# Patient Record
Sex: Female | Born: 1980 | Race: White | Hispanic: No | Marital: Married | Smoking: Never smoker
Health system: Southern US, Community
[De-identification: ages and names within clinical notes are randomized; demographics above are authoritative.]

## PROBLEM LIST (undated history)

## (undated) DIAGNOSIS — N83202 Unspecified ovarian cyst, left side: Secondary | ICD-10-CM

## (undated) DIAGNOSIS — K219 Gastro-esophageal reflux disease without esophagitis: Secondary | ICD-10-CM

## (undated) DIAGNOSIS — N83201 Unspecified ovarian cyst, right side: Secondary | ICD-10-CM

## (undated) DIAGNOSIS — N809 Endometriosis, unspecified: Secondary | ICD-10-CM

## (undated) HISTORY — DX: Unspecified ovarian cyst, left side: N83.202

## (undated) HISTORY — DX: Unspecified ovarian cyst, right side: N83.201

## (undated) HISTORY — DX: Endometriosis, unspecified: N80.9

## (undated) HISTORY — DX: Gastro-esophageal reflux disease without esophagitis: K21.9

---

## 2000-10-11 HISTORY — PX: DIAGNOSTIC LAPAROSCOPY: SUR761

## 2005-10-11 HISTORY — PX: TOOTH EXTRACTION: SUR596

## 2010-10-11 HISTORY — PX: INTRAUTERINE DEVICE INSERTION: SHX323

## 2013-06-14 ENCOUNTER — Ambulatory Visit (INDEPENDENT_AMBULATORY_CARE_PROVIDER_SITE_OTHER): Payer: 59 | Admitting: *Deleted

## 2013-06-14 DIAGNOSIS — Z23 Encounter for immunization: Secondary | ICD-10-CM

## 2013-06-22 ENCOUNTER — Encounter: Payer: Self-pay | Admitting: Family Medicine

## 2013-06-22 ENCOUNTER — Ambulatory Visit (INDEPENDENT_AMBULATORY_CARE_PROVIDER_SITE_OTHER): Payer: 59 | Admitting: Family Medicine

## 2013-06-22 VITALS — BP 98/69 | HR 73 | Resp 16 | Ht 67.0 in | Wt 125.0 lb

## 2013-06-22 DIAGNOSIS — H5789 Other specified disorders of eye and adnexa: Secondary | ICD-10-CM

## 2013-06-22 DIAGNOSIS — H579 Unspecified disorder of eye and adnexa: Secondary | ICD-10-CM

## 2013-06-22 DIAGNOSIS — J069 Acute upper respiratory infection, unspecified: Secondary | ICD-10-CM

## 2013-06-22 NOTE — Progress Notes (Signed)
  Subjective:    Patient ID: Amanda Cuevas, female    DOB: [birth year], 33 y.o.   MRN: [id number]  Amanda Cuevas is here today to discuss the conditions listed below:    Eye Problem  The right eye is affected.The current episode started yesterday. The problem has been gradually worsening. There was no injury mechanism. The pain is at a severity of 6/10. The pain is moderate. There is no known exposure to pink eye. She wears contacts. Associated symptoms include an eye discharge, eye redness, a foreign body sensation, photophobia and a recent URI. She has tried nothing for the symptoms.  URI  This is a new problem. The current episode started yesterday. The problem has been gradually worsening. There has been no fever. Associated symptoms include congestion, ear pain (Pressure ), a plugged ear sensation, rhinorrhea and sneezing. She has tried antihistamine for the symptoms. The treatment provided mild relief.    Review of Systems  HENT: Positive for congestion, ear pain (Pressure ), rhinorrhea and sneezing.   Eyes: Positive for photophobia, pain, discharge and redness.    Past Medical History  Diagnosis Date  . Bilateral ovarian cysts   . Endometriosis     Family History  Problem Relation Age of Onset  . Cancer Mother     Lung Cancer  . Stroke Mother   . Depression Mother   . COPD Mother   . Heart disease Mother   . Hypertension Mother   . Learning disabilities Mother   . Heart disease Maternal Grandmother   . Heart disease Maternal Grandfather   . Cancer Maternal Grandfather     Lung Cancer    History   Social History Narrative   Marital Status: Married Psychiatric nurse)   Children:  Son Eliberto Ivory) Daughter Luz Brazen)    Pets:  Dog Recruitment consultant)   Living Situation: Lives with  husband and children   Occupation: Production designer, theatre/television/film - [employer]   Education: College   Tobacco Use/Exposure:  None    Alcohol Use:  None   Drug Use:  None   Diet:  Regular   Exercise:  None   Hobbies:  Scrapbooking, Reading       Objective:   Physical Exam  Vitals reviewed. Constitutional: She is oriented to person, place, and time. She appears well-developed and well-nourished. No distress.  Eyes: EOM are normal. Pupils are equal, round, and reactive to light.  No sign of cornea abrasion   Cardiovascular: Normal rate and regular rhythm.   Pulmonary/Chest: Effort normal and breath sounds normal.  Neurological: She is alert and oriented to person, place, and time.  Skin: Skin is warm and dry.  Psychiatric: She has a normal mood and affect.      Assessment & Plan:

## 2013-08-14 ENCOUNTER — Other Ambulatory Visit: Payer: 59

## 2013-08-15 ENCOUNTER — Other Ambulatory Visit: Payer: 59

## 2013-08-15 LAB — CBC WITH DIFFERENTIAL/PLATELET
Basophils Absolute: 0 10*3/uL (ref 0.0–0.1)
Basophils Relative: 0 % (ref 0–1)
Eosinophils Absolute: 0 10*3/uL (ref 0.0–0.7)
Eosinophils Relative: 1 % (ref 0–5)
HCT: 39.3 % (ref 36.0–46.0)
Hemoglobin: 13.9 g/dL (ref 12.0–15.0)
Lymphocytes Relative: 27 % (ref 12–46)
Lymphs Abs: 1.4 10*3/uL (ref 0.7–4.0)
MCH: 30.7 pg (ref 26.0–34.0)
MCHC: 35.4 g/dL (ref 30.0–36.0)
MCV: 86.8 fL (ref 78.0–100.0)
Monocytes Absolute: 0.3 10*3/uL (ref 0.1–1.0)
Monocytes Relative: 5 % (ref 3–12)
Neutro Abs: 3.4 10*3/uL (ref 1.7–7.7)
Neutrophils Relative %: 67 % (ref 43–77)
Platelets: 182 10*3/uL (ref 150–400)
RBC: 4.53 MIL/uL (ref 3.87–5.11)
RDW: 12 % (ref 11.5–15.5)
WBC: 5 10*3/uL (ref 4.0–10.5)

## 2013-08-15 LAB — COMPLETE METABOLIC PANEL WITH GFR
ALT: 12 U/L (ref 0–35)
AST: 13 U/L (ref 0–37)
Albumin: 4.6 g/dL (ref 3.5–5.2)
Alkaline Phosphatase: 46 U/L (ref 39–117)
BUN: 11 mg/dL (ref 6–23)
CO2: 25 mEq/L (ref 19–32)
Calcium: 9.3 mg/dL (ref 8.4–10.5)
Chloride: 104 mEq/L (ref 96–112)
Creat: 0.56 mg/dL (ref 0.50–1.10)
GFR, Est African American: >89 mL/min
GFR, Est Non African American: >89 mL/min
Glucose, Bld: 115 mg/dL — ABNORMAL HIGH (ref 70–99)
Potassium: 3.9 mEq/L (ref 3.5–5.3)
Sodium: 136 mEq/L (ref 135–145)
Total Bilirubin: 0.6 mg/dL (ref 0.3–1.2)
Total Protein: 7.1 g/dL (ref 6.0–8.3)

## 2013-08-15 LAB — LIPID PANEL
Cholesterol: 139 mg/dL (ref 0–200)
HDL: 56 mg/dL (ref >39)
LDL Cholesterol: 77 mg/dL (ref 0–99)
Total CHOL/HDL Ratio: 2.5 Ratio
Triglycerides: 31 mg/dL (ref <150)
VLDL: 6 mg/dL (ref 0–40)

## 2013-08-15 LAB — TSH: TSH: 0.644 u[IU]/mL (ref 0.350–4.500)

## 2013-08-24 ENCOUNTER — Ambulatory Visit (INDEPENDENT_AMBULATORY_CARE_PROVIDER_SITE_OTHER): Payer: 59 | Admitting: Family Medicine

## 2013-08-24 ENCOUNTER — Encounter: Payer: Self-pay | Admitting: Family Medicine

## 2013-08-24 VITALS — BP 106/70 | HR 78 | Resp 16 | Ht 67.0 in | Wt 126.0 lb

## 2013-08-24 DIAGNOSIS — L708 Other acne: Secondary | ICD-10-CM

## 2013-08-24 DIAGNOSIS — L709 Acne, unspecified: Secondary | ICD-10-CM

## 2013-08-24 DIAGNOSIS — F4321 Adjustment disorder with depressed mood: Secondary | ICD-10-CM

## 2013-08-24 MED ORDER — DOXYCYCLINE HYCLATE 50 MG PO CAPS: 50.0000 mg | ORAL_CAPSULE | Freq: Two times a day (BID) | ORAL | Status: DC

## 2013-08-24 MED ORDER — MUPIROCIN CALCIUM 2 % EX CREA: TOPICAL_CREAM | CUTANEOUS | Status: DC

## 2013-08-24 NOTE — Progress Notes (Signed)
  Subjective:    Patient ID: Amanda Cuevas, female    DOB: [birth year], 32 y.o.   MRN: [id number]  HPI  Amanda Cuevas is here today to discuss her acne. She never had trouble with this as a teenager and has just recently developed this problem.  She has used OTC acne creams but they have not helped. She describes her acne as mild to moderate at times and says that she feels that it is worsening.  Her outbreaks are concentrated on her chin and around her jaw line.     Review of Systems  Constitutional: Negative.   HENT: Negative.   Eyes: Negative.   Respiratory: Negative.   Cardiovascular: Negative.   Gastrointestinal: Negative.   Endocrine: Negative.   Genitourinary: Negative.   Musculoskeletal: Negative.   Skin:       Acne on chin  Allergic/Immunologic: Negative.   Neurological: Negative.   Hematological: Negative.   Psychiatric/Behavioral: Negative.      Past Medical History  Diagnosis Date  . Bilateral ovarian cysts   . Endometriosis       Family History  Problem Relation Age of Onset  . Cancer Mother     Lung Cancer  . Stroke Mother   . Depression Mother   . COPD Mother   . Heart disease Mother   . Hypertension Mother   . Learning disabilities Mother   . Heart disease Maternal Grandmother   . Heart disease Maternal Grandfather   . 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 Constitutional: She is oriented to person, place, and time. She appears well-developed and  well-nourished.  HENT:  Head: Normocephalic.  Eyes: Conjunctivae are normal. No scleral icterus.  Neck: Normal range of motion. Neck supple. No thyromegaly present.  Cardiovascular: Normal rate.   Pulmonary/Chest: Effort normal and breath sounds normal.  Musculoskeletal: Normal range of motion.  Neurological: She is alert and oriented to person, place, and time.  Skin: Skin is warm and dry.  Mild acne is present (Chin)   Psychiatric: She has a normal mood and affect. Her behavior is normal. Judgment and thought content normal.  She gets tearful when she speaks of her mom.            Assessment & Plan:

## 2013-08-25 DIAGNOSIS — H5789 Other specified disorders of eye and adnexa: Secondary | ICD-10-CM | POA: Insufficient documentation

## 2013-08-25 DIAGNOSIS — J069 Acute upper respiratory infection, unspecified: Secondary | ICD-10-CM | POA: Insufficient documentation

## 2013-08-25 NOTE — Assessment & Plan Note (Signed)
A fluorescein strip strip was used to check for a cornea abrasion.

## 2013-08-25 NOTE — Assessment & Plan Note (Signed)
She is go try a decongestant for her ear pressure.

## 2013-08-26 DIAGNOSIS — F4321 Adjustment disorder with depressed mood: Secondary | ICD-10-CM | POA: Insufficient documentation

## 2013-08-26 DIAGNOSIS — L709 Acne, unspecified: Secondary | ICD-10-CM | POA: Insufficient documentation

## 2013-08-26 NOTE — Assessment & Plan Note (Signed)
Amanda Cuevas continues to struggle after losing her mom in July.  She knows that the holidays are going to be a rough time for her and her family. She was given samples of Viibryd 10/20 mg to try.

## 2013-08-26 NOTE — Patient Instructions (Signed)
1)  Acne - Try the combination of doxycycline, Bactroban and Retin A Micro.    2)  Mood - She was given samples of Viibryd to have on hand if she decides to try it.   Acne Acne is a skin problem that causes pimples. Acne occurs when the pores in your skin get blocked. Your pores may become red, sore, and swollen (inflamed), or infected with a common skin bacterium (Propionibacterium acnes). Acne is a common skin problem. Up to 80% of people get acne at some time. Acne is especially common from the ages of 69 to 47. Acne usually goes away over time with proper treatment. CAUSES  Your pores each contain an oil gland. The oil glands make an oily substance called sebum. Acne happens when these glands get plugged with sebum, dead skin cells, and dirt. The P. acnes bacteria that are normally found in the oil glands then multiply, causing inflammation. Acne is commonly triggered by changes in your hormones. These hormonal changes can cause the oil glands to get bigger and to make more sebum. Factors that can make acne worse include:  Hormone changes during adolescence.  Hormone changes during women's menstrual cycles.  Hormone changes during pregnancy.  Oil-based cosmetics and hair products.  Harshly scrubbing the skin.  Strong soaps.  Stress.  Hormone problems due to certain diseases.  Long or oily hair rubbing against the skin.  Certain medicines.  Pressure from headbands, backpacks, or shoulder pads.  Exposure to certain oils and chemicals. SYMPTOMS  Acne often occurs on the face, neck, chest, and upper back. Symptoms include:  Small, red bumps (pimples or papules).  Whiteheads (closed comedones).  Blackheads (open comedones).  Small, pus-filled pimples (pustules).  Big, red pimples or pustules that feel tender. More severe acne can cause:  An infected area that contains a collection of pus (abscess).  Hard, painful, fluid-filled sacs (cysts).  Scars. DIAGNOSIS  Your  caregiver can usually tell what the problem is by doing a physical exam. TREATMENT  There are many good treatments for acne. Some are available over-the-counter and some are available with a prescription. The treatment that is best for you depends on the type of acne you have and how severe it is. It may take 2 months of treatment before your acne gets better. Common treatments include:  Creams and lotions that prevent oil glands from clogging.  Creams and lotions that treat or prevent infections and inflammation.  Antibiotics applied to the skin or taken as a pill.  Pills that decrease sebum production.  Birth control pills.  Light or laser treatments.  Minor surgery.  Injections of medicine into the affected areas.  Chemicals that cause peeling of the skin. HOME CARE INSTRUCTIONS  Good skin care is the most important part of treatment.  Wash your skin gently at least twice a day and after exercise. Always wash your skin before bed.  Use mild soap.  After each wash, apply a water-based skin moisturizer.  Keep your hair clean and off of your face. Shampoo your hair daily.  Only take medicines as directed by your caregiver.  Use a sunscreen or sunblock with SPF 30 or greater. This is especially important when you are using acne medicines.  Choose cosmetics that are noncomedogenic. This means they do not plug the oil glands.  Avoid leaning your chin or forehead on your hands.  Avoid wearing tight headbands or hats.  Avoid picking or squeezing your pimples. This can make your acne worse  and cause scarring. SEEK MEDICAL CARE IF:   Your acne is not better after 8 weeks.  Your acne gets worse.  You have a large area of skin that is red or tender. Document Released: 09/24/2000 Document Revised: 12/20/2011 Document Reviewed: 07/16/2011 [facility] Patient Information 2014 [place], [place].

## 2013-08-26 NOTE — Assessment & Plan Note (Signed)
She is to try a combination of doxycycline, Retin A Micro (0.4%) and Bactroban.  We'll see how this works. She might benefit from some Epiduo and/or Veltin.  She may try samples in the future.

## 2013-08-27 ENCOUNTER — Ambulatory Visit (INDEPENDENT_AMBULATORY_CARE_PROVIDER_SITE_OTHER): Payer: 59 | Admitting: Family Medicine

## 2013-08-27 ENCOUNTER — Encounter: Payer: Self-pay | Admitting: Family Medicine

## 2013-08-27 VITALS — BP 113/62 | HR 86 | Resp 16 | Ht 67.0 in | Wt 128.0 lb

## 2013-08-27 DIAGNOSIS — L709 Acne, unspecified: Secondary | ICD-10-CM

## 2013-08-27 DIAGNOSIS — R7301 Impaired fasting glucose: Secondary | ICD-10-CM

## 2013-08-27 DIAGNOSIS — L708 Other acne: Secondary | ICD-10-CM

## 2013-08-27 DIAGNOSIS — K589 Irritable bowel syndrome without diarrhea: Secondary | ICD-10-CM

## 2013-08-27 DIAGNOSIS — Z0000 Encounter for general adult medical examination without abnormal findings: Secondary | ICD-10-CM

## 2013-08-27 LAB — POCT URINALYSIS DIPSTICK
Bilirubin, UA: NEGATIVE
Blood, UA: NEGATIVE
Glucose, UA: NEGATIVE
Ketones, UA: NEGATIVE
Leukocytes, UA: NEGATIVE
Nitrite, UA: NEGATIVE
Protein, UA: NEGATIVE
Spec Grav, UA: <=1.005
Urobilinogen, UA: NEGATIVE
pH, UA: 8

## 2013-08-27 LAB — POCT GLYCOSYLATED HEMOGLOBIN (HGB A1C): Hemoglobin A1C: 4.7

## 2013-08-27 MED ORDER — HYOSCYAMINE SULFATE 0.125 MG SL SUBL: 0.1250 mg | SUBLINGUAL_TABLET | Freq: Four times a day (QID) | SUBLINGUAL | Status: DC | PRN

## 2013-08-27 MED ORDER — MUPIROCIN CALCIUM 2 % EX CREA: TOPICAL_CREAM | CUTANEOUS | Status: DC

## 2013-08-27 MED ORDER — DOXYCYCLINE HYCLATE 50 MG PO CAPS: 50.0000 mg | ORAL_CAPSULE | Freq: Two times a day (BID) | ORAL | Status: DC

## 2013-08-27 NOTE — Progress Notes (Signed)
Subjective:    Patient ID: Amanda Cuevas, female    DOB: [birth year], 32 y.o.   MRN: [id number]  HPI  Amanda Cuevas is here today for her annual CPE.  She has done pretty well since her last office visit. She continues to struggle with acne which is new for her and IBS symptoms.     Review of Systems  Constitutional: Negative.   HENT: Negative.   Eyes: Negative.   Respiratory: Negative.   Cardiovascular: Negative.   Gastrointestinal: Negative.   Endocrine: Negative.   Genitourinary: Negative.   Musculoskeletal: Negative.   Skin: Negative.   Allergic/Immunologic: Negative.   Neurological: Negative.   Hematological: Negative.   Psychiatric/Behavioral: Negative.      Past Medical History  Diagnosis Date  . Bilateral ovarian cysts   . Endometriosis      Family History  Problem Relation Age of Onset  . Cancer Mother     Lung Cancer  . Stroke Mother   . Depression Mother   . COPD Mother   . Heart disease Mother   . Hypertension Mother   . Learning disabilities Mother   . Heart disease Maternal Grandmother   . Heart disease Maternal Grandfather   . 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 Constitutional: She is oriented to person, place, and time. She appears well-developed and well-nourished.  HENT:  Head: Normocephalic and atraumatic.  Right Ear: External ear normal.  Left Ear: External ear normal.  Nose: Nose normal.  Mouth/Throat: Oropharynx is  clear and moist.  Eyes: Conjunctivae and EOM are normal. Pupils are equal, round, and reactive to light.  Neck: Normal range of motion. No thyromegaly present.  Cardiovascular: Normal rate, regular rhythm, normal heart sounds and intact distal pulses.  Exam reveals no gallop and no friction rub.   No murmur heard. Pulmonary/Chest: Effort normal and breath sounds normal. Right breast exhibits no inverted nipple, no mass, no nipple discharge, no skin change and no tenderness. Left breast exhibits no inverted nipple, no mass, no nipple discharge, no skin change and no tenderness. Breasts are symmetrical.  Abdominal: Soft. Bowel sounds are normal.  Genitourinary: No breast swelling, tenderness, discharge or bleeding.  Musculoskeletal: Normal range of motion. She exhibits no edema and no tenderness.  Lymphadenopathy:    She has no cervical adenopathy.  Neurological: She is alert and oriented to person, place, and time. She has normal reflexes.  Skin: Skin is warm and dry.  Psychiatric: She has a normal mood and affect. Her behavior is normal. Judgment and thought content normal.      Assessment & Plan:    Iliyah was seen today for annual exam.  Diagnoses and associated orders for this visit:  Routine general medical examination at a health care facility Comments: Normal Exam.   - POCT urinalysis dipstick   Impaired fasting glucose Comments: Her FBS was  elevated 115 so we checked an A1c which was perfect at 4.7.   - POCT HgB A1C  Acne Comments: She continues to struggle with her acne.  She'll try some doxycycline and Bactroban.   - doxycycline (VIBRAMYCIN) 50 MG capsule; Take 1 capsule (50 mg total) by mouth 2 (two) times daily. - mupirocin cream (BACTROBAN) 2 %; Apply to affected area 3 times daily  IBS (irritable bowel syndrome) Comments: She has been having some increased IBS symptoms especially when she eats what she shouldn't. She is to try some Levsin.   - Discontinue:  hyoscyamine (LEVSIN SL) 0.125 MG SL tablet; Place 1 tablet (0.125 mg total) under the tongue every 6 (six) hours as needed.  TIME SPENT "FACE TO FACE" WITH PATIENT -  45 MINS

## 2013-09-18 ENCOUNTER — Encounter: Payer: Self-pay | Admitting: *Deleted

## 2013-09-18 ENCOUNTER — Ambulatory Visit (INDEPENDENT_AMBULATORY_CARE_PROVIDER_SITE_OTHER): Payer: 59 | Admitting: Family Medicine

## 2013-09-18 VITALS — BP 113/71 | HR 78 | Resp 16 | Wt 128.0 lb

## 2013-09-18 DIAGNOSIS — H6120 Impacted cerumen, unspecified ear: Secondary | ICD-10-CM

## 2013-09-18 DIAGNOSIS — H9209 Otalgia, unspecified ear: Secondary | ICD-10-CM

## 2013-09-18 NOTE — Progress Notes (Signed)
   Subjective:    Patient ID: Amanda Cuevas, female    DOB: [birth year], 32 y.o.   MRN: [id number]   Amanda Cuevas is here today complaining of bilateral ear pain and fullness.  Amanda Cuevas feels that Amanda Cuevas needs to have her ears flushed.      Otalgia  There is pain in both ears. The current episode started in the past 7 days. The problem occurs constantly. There has been no fever. The pain is moderate.    Review of Systems  HENT: Positive for ear pain.   Neurological: Positive for dizziness.  All other systems reviewed and are negative.     Past Medical History  Diagnosis Date  . Bilateral ovarian cysts   . Endometriosis      Past Surgical History  Procedure Laterality Date  . Tooth extraction  2007  . Diagnostic laparoscopy  2002    Ovarian Cysts - Endometriosis  . 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 Cancer Mother     Lung Cancer  . Stroke Mother   . Depression Mother   . COPD Mother   . Heart disease Mother   . Hypertension Mother   . Learning disabilities Mother   . Heart disease Maternal Grandmother   . Heart disease Maternal Grandfather   . Cancer Maternal Grandfather     Lung Cancer     No current outpatient prescriptions on file prior to visit.   No current facility-administered medications on file prior to visit.     No Known Allergies   Immunization History  Administered Date(s) Administered  . Hepatitis B 05/13/2009, 07/14/2009,  01/07/2010  . Influenza,inj,Quad PF,36+ Mos 06/14/2013  . Tdap 10/28/2010        Objective:   Physical Exam  Constitutional: Amanda Cuevas appears well-developed and well-nourished. No distress.  HENT:  Canals are blocked with cerumen.    Eyes: No scleral icterus.  Neck: No thyromegaly present.  Cardiovascular: Normal rate and regular rhythm.   Pulmonary/Chest: Effort normal and breath sounds normal.  Skin: Skin is warm and dry. No rash noted.  Psychiatric: Amanda Cuevas has a normal mood and affect. Her behavior is normal. Judgment and thought content normal.      Assessment & Plan:    Amanda Cuevas was seen today for otalgia.  Diagnoses and associated orders for this visit:  Otalgia, bilateral Comments: Amanda Cuevas is to try some Cortane B for her ears.  - Pramoxine-HC-Chloroxylenol Aq (CORTANE-B AQUEOUS) 10-10-1 MG/ML SOLN; Place 3 drops in ear(s) 3 (three) times daily.  Impacted cerumen, bilateral Comments: Her ears were flushed without difficulty.

## 2013-09-18 NOTE — Assessment & Plan Note (Signed)
The patient's ears were inspected and the ear canals were occluded with cerumen.  The canals were flushed using a syringe and butterfly needle tubing.  The cerumen was loosened and an ear scoop was used to remove pieces from the canal.  The patient tolerated the procedure fine.

## 2013-10-04 ENCOUNTER — Other Ambulatory Visit: Payer: Self-pay | Admitting: Family Medicine

## 2013-10-04 DIAGNOSIS — Z20828 Contact with and (suspected) exposure to other viral communicable diseases: Secondary | ICD-10-CM

## 2013-10-04 MED ORDER — OSELTAMIVIR PHOSPHATE 75 MG PO CAPS: 75.0000 mg | ORAL_CAPSULE | Freq: Every day | ORAL | Status: AC

## 2013-10-15 DIAGNOSIS — Z0000 Encounter for general adult medical examination without abnormal findings: Secondary | ICD-10-CM | POA: Insufficient documentation

## 2013-10-20 ENCOUNTER — Encounter: Payer: Self-pay | Admitting: Family Medicine

## 2013-10-20 DIAGNOSIS — R7301 Impaired fasting glucose: Secondary | ICD-10-CM | POA: Insufficient documentation

## 2013-10-20 DIAGNOSIS — K589 Irritable bowel syndrome without diarrhea: Secondary | ICD-10-CM | POA: Insufficient documentation

## 2013-10-20 DIAGNOSIS — H9209 Otalgia, unspecified ear: Secondary | ICD-10-CM | POA: Insufficient documentation

## 2013-10-20 MED ORDER — PRAMOXINE-HC-CHLOROXYLENOL AQ 10-10-1 MG/ML OT SOLN: 3.0000 [drp] | Freq: Three times a day (TID) | OTIC | Status: DC

## 2013-12-10 ENCOUNTER — Ambulatory Visit (HOSPITAL_BASED_OUTPATIENT_CLINIC_OR_DEPARTMENT_OTHER)
Admission: RE | Admit: 2013-12-10 | Discharge: 2013-12-10 | Disposition: A | Discharge status: Home / Self Care | Payer: 59 | Source: Other Acute Inpatient Hospital | Attending: Family Medicine | Admitting: Family Medicine

## 2013-12-10 ENCOUNTER — Encounter: Payer: Self-pay | Admitting: Family Medicine

## 2013-12-10 ENCOUNTER — Ambulatory Visit (INDEPENDENT_AMBULATORY_CARE_PROVIDER_SITE_OTHER): Payer: 59 | Admitting: Family Medicine

## 2013-12-10 ENCOUNTER — Ambulatory Visit (HOSPITAL_BASED_OUTPATIENT_CLINIC_OR_DEPARTMENT_OTHER)
Admission: RE | Admit: 2013-12-10 | Discharge: 2013-12-10 | Disposition: A | Discharge status: Home / Self Care | Payer: 59 | Source: Ambulatory Visit | Attending: Family Medicine | Admitting: Family Medicine

## 2013-12-10 VITALS — BP 95/64 | HR 106 | Temp 100.0°F | Resp 16

## 2013-12-10 DIAGNOSIS — R110 Nausea: Secondary | ICD-10-CM

## 2013-12-10 DIAGNOSIS — Z3202 Encounter for pregnancy test, result negative: Secondary | ICD-10-CM

## 2013-12-10 DIAGNOSIS — R52 Pain, unspecified: Secondary | ICD-10-CM

## 2013-12-10 DIAGNOSIS — R1013 Epigastric pain: Secondary | ICD-10-CM | POA: Insufficient documentation

## 2013-12-10 DIAGNOSIS — R109 Unspecified abdominal pain: Secondary | ICD-10-CM

## 2013-12-10 DIAGNOSIS — R509 Fever, unspecified: Secondary | ICD-10-CM

## 2013-12-10 DIAGNOSIS — M549 Dorsalgia, unspecified: Secondary | ICD-10-CM

## 2013-12-10 LAB — CBC WITH DIFFERENTIAL/PLATELET
Basophils Absolute: 0 10*3/uL (ref 0.0–0.1)
Basophils Relative: 0 % (ref 0–1)
Eosinophils Absolute: 0 10*3/uL (ref 0.0–0.7)
Eosinophils Relative: 0 % (ref 0–5)
HCT: 38.3 % (ref 36.0–46.0)
Hemoglobin: 13.3 g/dL (ref 12.0–15.0)
Lymphocytes Relative: 6 % — ABNORMAL LOW (ref 12–46)
Lymphs Abs: 0.4 10*3/uL — ABNORMAL LOW (ref 0.7–4.0)
MCH: 31.3 pg (ref 26.0–34.0)
MCHC: 34.7 g/dL (ref 30.0–36.0)
MCV: 90.1 fL (ref 78.0–100.0)
Monocytes Absolute: 0.3 10*3/uL (ref 0.1–1.0)
Monocytes Relative: 5 % (ref 3–12)
Neutro Abs: 5.1 10*3/uL (ref 1.7–7.7)
Neutrophils Relative %: 89 % — ABNORMAL HIGH (ref 43–77)
Platelets: 134 10*3/uL — ABNORMAL LOW (ref 150–400)
RBC: 4.25 MIL/uL (ref 3.87–5.11)
RDW: 12.1 % (ref 11.5–15.5)
WBC: 5.7 10*3/uL (ref 4.0–10.5)

## 2013-12-10 LAB — POCT URINALYSIS DIPSTICK
Blood, UA: NEGATIVE
Glucose, UA: NEGATIVE
Ketones, UA: NEGATIVE
Leukocytes, UA: NEGATIVE
Nitrite, UA: NEGATIVE
Protein, UA: NEGATIVE
Spec Grav, UA: 1.01
Urobilinogen, UA: NEGATIVE
pH, UA: 5

## 2013-12-10 LAB — COMPREHENSIVE METABOLIC PANEL
ALT: 16 U/L (ref 0–35)
AST: 17 U/L (ref 0–37)
Albumin: 4.2 g/dL (ref 3.5–5.2)
Alkaline Phosphatase: 52 U/L (ref 39–117)
BUN: 12 mg/dL (ref 6–23)
CO2: 25 mEq/L (ref 19–32)
Calcium: 8.9 mg/dL (ref 8.4–10.5)
Chloride: 100 mEq/L (ref 96–112)
Creatinine, Ser: 0.7 mg/dL (ref 0.50–1.10)
GFR calc Af Amer: >90 mL/min (ref >90)
GFR calc non Af Amer: >90 mL/min (ref >90)
Glucose, Bld: 114 mg/dL — ABNORMAL HIGH (ref 70–99)
Potassium: 3.9 mEq/L (ref 3.7–5.3)
Sodium: 138 mEq/L (ref 137–147)
Total Bilirubin: 0.7 mg/dL (ref 0.3–1.2)
Total Protein: 6.7 g/dL (ref 6.0–8.3)

## 2013-12-10 LAB — LIPASE, BLOOD: Lipase: 19 U/L (ref 11–59)

## 2013-12-10 LAB — POCT URINE PREGNANCY: Preg Test, Ur: NEGATIVE

## 2013-12-10 MED ORDER — SUCRALFATE 1 G PO TABS: 1.0000 g | ORAL_TABLET | Freq: Four times a day (QID) | ORAL | Status: DC

## 2013-12-10 MED ORDER — ONDANSETRON 8 MG PO TBDP: 8.0000 mg | ORAL_TABLET | Freq: Three times a day (TID) | ORAL | Status: DC | PRN

## 2013-12-10 NOTE — Progress Notes (Signed)
Subjective:    Patient ID: Amanda AmatoMartha Elaine Cuevas, female    DOB: 02-06-1981, 33 y.o.   MRN: [id number]  Amanda Cuevas is here today with her husband complaining of generalized abdominal pain.  She [place] yesterday for lunch.  She developed an abdominal discomfort with diarrhea and her symptoms have worsened.    Abdominal Pain This is a new problem. The current episode started yesterday. The onset quality is sudden. The problem occurs constantly. The problem has been gradually worsening. The pain is located in the generalized abdominal region. The pain is at a severity of 6/10. The pain is moderate. The quality of the pain is cramping and a sensation of fullness. Pain radiation: low and mid back. Associated symptoms include belching, diarrhea, a fever, headaches and myalgias. The pain is aggravated by movement. The pain is relieved by nothing. She has tried nothing for the symptoms.     Review of Systems  Constitutional: Positive for fever.  Gastrointestinal: Positive for abdominal pain and diarrhea.  Musculoskeletal: Positive for myalgias.  Neurological: Positive for headaches.     Past Medical History  Diagnosis Date  . Bilateral ovarian cysts   . Endometriosis      Past Surgical History  Procedure Laterality Date  . Tooth extraction  2007  . Diagnostic laparoscopy  2002    Ovarian Cysts - Endometriosis  . 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 Cancer  Mother     Lung Cancer  . Stroke Mother   . Depression Mother   . COPD Mother   . Heart disease Mother   . Hypertension Mother   . Learning disabilities Mother   . Heart disease Maternal Grandmother   . Heart disease Maternal Grandfather   . Cancer Maternal Grandfather     Lung Cancer     No Known Allergies   Immunization History  Administered Date(s) Administered  . Hepatitis B 05/13/2009, 07/14/2009, 01/07/2010  . Influenza,inj,Quad PF,36+ Mos 06/14/2013  . Tdap 10/28/2010       Objective:   Physical Exam  Constitutional: No distress.  HENT:  Nose: Nose normal.  Mouth/Throat: Oropharynx is clear and moist and mucous membranes are normal.  Eyes: No scleral icterus.  Neck: Neck supple.  Cardiovascular: Normal rate, regular rhythm and normal heart sounds.   Pulmonary/Chest: Effort normal and breath sounds normal.  Abdominal: Soft. Bowel sounds are normal. She exhibits no distension and no mass. There is tenderness. There is no rebound and no guarding.  Musculoskeletal: She exhibits no edema.  Lymphadenopathy:    She has no cervical adenopathy.  Neurological: She is alert.  Skin: Skin is warm and dry. She is not diaphoretic.  Psychiatric: She has a normal mood and affect. Her behavior is normal. Judgment and thought content normal.      Assessment & Plan:    Amanda Cuevas was seen today  for abdominal pain.  Diagnoses and associated orders for this visit:  Abdominal pain, epigastric - US Abdomen Complete - CBC w/Diff - Lipase - Comprehensive metabolic panel - sucralfate (CARAFATE) 1 G tablet; Take 1 tablet (1 g total) by mouth 4 (four) times daily. - NM Hepatobiliary; Future  Back pain - POCT urinalysis dipstick  Negative pregnancy test - POCT urine pregnancy  Fever, unspecified - Urine Culture  Nausea alone - ondansetron (ZOFRAN ODT) 8 MG disintegrating tablet; Take 1 tablet (8 mg total) by mouth every 8 (eight) hours as needed for nausea or  vomiting.  Acute left flank pain - CT Abdomen Pelvis Wo Contrast; Future   TIME SPENT "FACE TO FACE" WITH PATIENT -  30 MINS

## 2013-12-11 LAB — URINE CULTURE
Colony Count: NO GROWTH
Organism ID, Bacteria: NO GROWTH

## 2014-04-26 ENCOUNTER — Ambulatory Visit (HOSPITAL_BASED_OUTPATIENT_CLINIC_OR_DEPARTMENT_OTHER)
Admission: RE | Admit: 2014-04-26 | Discharge: 2014-04-26 | Disposition: A | Discharge status: Home / Self Care | Payer: 59 | Source: Ambulatory Visit | Attending: Family Medicine | Admitting: Family Medicine

## 2014-04-26 ENCOUNTER — Other Ambulatory Visit: Payer: Self-pay | Admitting: Family Medicine

## 2014-04-26 DIAGNOSIS — M542 Cervicalgia: Secondary | ICD-10-CM

## 2014-04-26 DIAGNOSIS — M47812 Spondylosis without myelopathy or radiculopathy, cervical region: Secondary | ICD-10-CM | POA: Insufficient documentation

## 2014-05-03 ENCOUNTER — Other Ambulatory Visit: Payer: Self-pay | Admitting: Family Medicine

## 2014-05-03 ENCOUNTER — Other Ambulatory Visit: Payer: Self-pay | Admitting: *Deleted

## 2014-05-03 DIAGNOSIS — R5383 Other fatigue: Principal | ICD-10-CM

## 2014-05-03 DIAGNOSIS — R5381 Other malaise: Secondary | ICD-10-CM

## 2014-05-03 LAB — CBC WITH DIFFERENTIAL/PLATELET
Basophils Absolute: 0 10*3/uL (ref 0.0–0.1)
Basophils Relative: 1 % (ref 0–1)
Eosinophils Absolute: 0 10*3/uL (ref 0.0–0.7)
Eosinophils Relative: 1 % (ref 0–5)
HCT: 35.1 % — ABNORMAL LOW (ref 36.0–46.0)
Hemoglobin: 12.6 g/dL (ref 12.0–15.0)
Lymphocytes Relative: 37 % (ref 12–46)
Lymphs Abs: 1.4 10*3/uL (ref 0.7–4.0)
MCH: 30.6 pg (ref 26.0–34.0)
MCHC: 35.9 g/dL (ref 30.0–36.0)
MCV: 85.2 fL (ref 78.0–100.0)
Monocytes Absolute: 0.2 10*3/uL (ref 0.1–1.0)
Monocytes Relative: 6 % (ref 3–12)
Neutro Abs: 2 10*3/uL (ref 1.7–7.7)
Neutrophils Relative %: 55 % (ref 43–77)
Platelets: 185 10*3/uL (ref 150–400)
RBC: 4.12 MIL/uL (ref 3.87–5.11)
RDW: 13 % (ref 11.5–15.5)
WBC: 3.7 10*3/uL — ABNORMAL LOW (ref 4.0–10.5)

## 2014-05-03 LAB — COMPLETE METABOLIC PANEL WITH GFR
ALT: 10 U/L (ref 0–35)
AST: 13 U/L (ref 0–37)
Albumin: 4.2 g/dL (ref 3.5–5.2)
Alkaline Phosphatase: 42 U/L (ref 39–117)
BUN: 11 mg/dL (ref 6–23)
CO2: 26 mEq/L (ref 19–32)
Calcium: 8.8 mg/dL (ref 8.4–10.5)
Chloride: 106 mEq/L (ref 96–112)
Creat: 0.65 mg/dL (ref 0.50–1.10)
GFR, Est African American: >89 mL/min
GFR, Est Non African American: >89 mL/min
Glucose, Bld: 89 mg/dL (ref 70–99)
Potassium: 3.8 mEq/L (ref 3.5–5.3)
Sodium: 139 mEq/L (ref 135–145)
Total Bilirubin: 0.5 mg/dL (ref 0.2–1.2)
Total Protein: 6.6 g/dL (ref 6.0–8.3)

## 2014-05-04 LAB — T4, FREE: Free T4: 1.16 ng/dL (ref 0.80–1.80)

## 2014-05-04 LAB — TSH: TSH: 1.032 u[IU]/mL (ref 0.350–4.500)

## 2014-06-14 ENCOUNTER — Other Ambulatory Visit: Payer: Self-pay | Admitting: Family Medicine

## 2014-06-14 DIAGNOSIS — K219 Gastro-esophageal reflux disease without esophagitis: Secondary | ICD-10-CM

## 2014-06-14 MED ORDER — PANTOPRAZOLE SODIUM 40 MG PO TBEC: 40.0000 mg | DELAYED_RELEASE_TABLET | Freq: Two times a day (BID) | ORAL | Status: DC

## 2015-10-30 ENCOUNTER — Other Ambulatory Visit: Payer: Self-pay | Admitting: Obstetrics and Gynecology

## 2015-10-30 MED ORDER — MISOPROSTOL 200 MCG PO TABS: ORAL_TABLET | ORAL | Status: DC

## 2015-10-31 ENCOUNTER — Ambulatory Visit (INDEPENDENT_AMBULATORY_CARE_PROVIDER_SITE_OTHER): Payer: 59 | Admitting: Obstetrics and Gynecology

## 2015-10-31 ENCOUNTER — Encounter: Payer: Self-pay | Admitting: Obstetrics and Gynecology

## 2015-10-31 VITALS — BP 100/70 | HR 88 | Resp 16 | Ht 67.0 in | Wt 137.0 lb

## 2015-10-31 DIAGNOSIS — Z30433 Encounter for removal and reinsertion of intrauterine contraceptive device: Secondary | ICD-10-CM

## 2015-10-31 DIAGNOSIS — Z01419 Encounter for gynecological examination (general) (routine) without abnormal findings: Secondary | ICD-10-CM | POA: Diagnosis not present

## 2015-10-31 DIAGNOSIS — Z1151 Encounter for screening for human papillomavirus (HPV): Secondary | ICD-10-CM | POA: Diagnosis not present

## 2015-10-31 DIAGNOSIS — Z124 Encounter for screening for malignant neoplasm of cervix: Secondary | ICD-10-CM

## 2015-10-31 NOTE — Patient Instructions (Signed)

## 2015-10-31 NOTE — Progress Notes (Signed)
35 y.o. No obstetric history on file. MarriedCaucasianF here for annual exam.  She doesn't have cycles with the mirena, random spotting. Occasional dyspareunia, deep, mostly fine.  At 19 she had laparoscopy, ? H/o endometriosis, h/o severe dysmenorrhea, no issues after having babies.     No LMP recorded. Patient is not currently having periods (Reason: IUD).          Sexually active: Yes.    The current method of family planning is IUD.    Exercising: No.  None Smoker:  No  Health Maintenance: Pap:  2012 WNL History of abnormal Pap:  no MMG:  Never Colonoscopy:  Never BMD:   Never TDaP:  2012 Gardasil: NA   reports that she has never smoked. She has never used smokeless tobacco. She reports that she does not drink alcohol or use illicit drugs.  Past Medical History  Diagnosis Date  . Bilateral ovarian cysts   . Endometriosis     Past Surgical History  Procedure Laterality Date  . Tooth extraction  2007  . Diagnostic laparoscopy  2002    Ovarian Cysts - Endometriosis  . Intrauterine device insertion  2012    [facility]    Current Outpatient Prescriptions  Medication Sig Dispense Refill  . misoprostol (CYTOTEC) 200 MCG tablet Place 2 tabs intravaginally 6-12 hours prior to IUD insertion 2 tablet 0   No current facility-administered medications for this visit.    Family History  Problem Relation Age of Onset  . Cancer Mother     Lung Cancer  . Stroke Mother   . Depression Mother   . COPD Mother   . Heart disease Mother   . Hypertension Mother   . Learning disabilities Mother   . Heart disease Maternal Grandmother   . Heart disease Maternal Grandfather   . Cancer Maternal Grandfather     Lung Cancer    Review of Systems  Constitutional: Negative.   HENT: Negative.   Eyes: Negative.   Respiratory: Negative.   Cardiovascular: Negative.   Gastrointestinal: Negative.   Endocrine: Negative.   Genitourinary: Negative.   Musculoskeletal: Negative.    Allergic/Immunologic: Negative.   Neurological: Negative.   Hematological: Negative.   Psychiatric/Behavioral: Negative.     Exam:   BP 100/70 mmHg  Pulse 88  Resp 16  Ht  (1.702 m)  Wt 137 lb (62.143 kg)  BMI 21.45 kg/m2  Weight change: @ Height:   Height:  (170.2 cm)  Ht Readings from Last 3 Encounters:  10/31/15  (1.702 m)  08/27/13  (1.702 m)  08/24/13  (1.702 m)    General appearance: alert, cooperative and appears stated age Head: Normocephalic, without obvious abnormality, atraumatic Neck: no adenopathy, supple, symmetrical, trachea midline and thyroid normal to inspection and palpation Lungs: clear to auscultation bilaterally Breasts: normal appearance, no masses or tenderness Heart: regular rate and rhythm Abdomen: soft, non-tender; bowel sounds normal; no masses,  no organomegaly Extremities: extremities normal, atraumatic, no cyanosis or edema Skin: Skin color, texture, turgor normal. No rashes or lesions Lymph nodes: Cervical, supraclavicular, and axillary nodes normal. No abnormal inguinal nodes palpated Neurologic: Grossly normal   Pelvic: External genitalia:  no lesions              Urethra:  normal appearing urethra with no masses, tenderness or lesions              Bartholins and Skenes: normal  Vagina: normal appearing vagina with normal color and discharge, no lesions              Cervix: no lesions and IUD string 2 cm               Bimanual Exam:  Uterus:  normal size, contour, position, consistency, mobility, non-tender and anteverted              Adnexa: no mass, fullness, tenderness               Rectovaginal: Confirms               Anus:  normal sphincter tone, no lesions   The risks of the mirena IUD were reviewed with the patient, including infection, abnormal bleeding and uterine perfortion. Consent was signed.  A speculum was placed in the vagina, the IUD was removed with ringed forceps.  The cervix was cleansed with betadine. A tenaculum was placed on the cervix, the uterus sounded to 8 cm.   The mirena IUD was inserted without difficulty. The string were cut to 3-4 cm. The tenaculum was removed. Slight oozing from the tenaculum site was stopped with pressure.   The patient tolerated the procedure well.    Chaperone was present for exam.  A:  Well Woman with normal exam  Contraception management, due for change of IUD  P:   Pap with hpv  Discussed calcium and vit d  Labs and immunizations with primary MD  Mirena IUD removed, new IUD inserted

## 2015-11-05 LAB — IPS PAP TEST WITH HPV

## 2015-11-10 ENCOUNTER — Telehealth: Payer: Self-pay | Admitting: Emergency Medicine

## 2015-11-10 NOTE — Telephone Encounter (Signed)
Patient notified of normal results.  02 recall placed.

## 2015-12-04 ENCOUNTER — Ambulatory Visit: Payer: 59 | Admitting: Obstetrics and Gynecology

## 2015-12-09 ENCOUNTER — Ambulatory Visit (INDEPENDENT_AMBULATORY_CARE_PROVIDER_SITE_OTHER): Payer: 59 | Admitting: Obstetrics and Gynecology

## 2015-12-09 ENCOUNTER — Encounter: Payer: Self-pay | Admitting: Obstetrics and Gynecology

## 2015-12-09 VITALS — BP 100/60 | HR 86 | Resp 16 | Ht 67.0 in | Wt 137.0 lb

## 2015-12-09 DIAGNOSIS — Z30431 Encounter for routine checking of intrauterine contraceptive device: Secondary | ICD-10-CM

## 2015-12-09 NOTE — Progress Notes (Signed)
GYNECOLOGY  VISIT   HPI: 35 y.o.   Married  Caucasian  female   G2P2002 with No LMP recorded. Patient is not currently having periods (Reason: IUD).   here for  IUD Check. No c/o  GYNECOLOGIC HISTORY: No LMP recorded. Patient is not currently having periods (Reason: IUD). Contraception: IUD  Menopausal hormone therapy: None        OB History    Gravida Para Term Preterm AB TAB SAB Ectopic Multiple Living   0 0 0 0 0 0 2         Patient Active Problem List   Diagnosis Date Noted  . Impaired fasting glucose 10/20/2013  . IBS (irritable bowel syndrome) 10/20/2013  . Otalgia 10/20/2013  . Routine general medical examination at a health care facility 10/15/2013  . Impacted cerumen 09/18/2013  . Acne 08/26/2013  . Feeling grief 08/26/2013  . Irritation of eye 08/25/2013  . Acute upper respiratory infections of unspecified site 08/25/2013    Past Medical History  Diagnosis Date  . Bilateral ovarian cysts   . Endometriosis     Past Surgical History  Procedure Laterality Date  . Tooth extraction  2007  . Diagnostic laparoscopy  2002    Ovarian Cysts - Endometriosis  . Intrauterine device insertion  2012    [facility]    Current Outpatient Prescriptions  Medication Sig Dispense Refill  . levonorgestrel (MIRENA) 20 MCG/24HR IUD 1 each by Intrauterine route once. Inserted 10/31/15     No current facility-administered medications for this visit.     ALLERGIES: Review of patient's allergies indicates no known allergies.  Family History  Problem Relation Age of Onset  . Cancer Mother     Lung Cancer  . Stroke Mother   . Depression Mother   . COPD Mother   . Heart disease Mother   . Hypertension Mother   . Learning disabilities Mother   . Heart disease Maternal Grandmother   . Heart disease Maternal Grandfather   . Cancer Maternal Grandfather     Lung Cancer    Social History   Social History  . Marital Status: Married    Spouse Name: Sophia Sperry  .  Number of Children: 2  . Years of Education: 14   Occupational History  . MANAGER/CMA  [employer]   Social History Main Topics  . Smoking status: Never Smoker   . Smokeless tobacco: Never Used  . Alcohol Use: No  . Drug Use: No  . Sexual Activity:    Partners: Male    Birth Control/ Protection: IUD     Comment: Mirena Inserted 10/31/15   Other Topics Concern  . 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   Neurological: Positive for headaches.  All other systems reviewed and are negative.   PHYSICAL EXAMINATION:    BP 100/60 mmHg  Pulse 86  Resp 16  Ht  (1.702 m)  Wt 137  lb (62.143 kg)  BMI 21.45 kg/m2    General appearance: alert, cooperative and appears stated age  Pelvic: External genitalia:  no lesions              Urethra:  normal appearing urethra with no masses, tenderness or lesions              Bartholins and Skenes: normal                 Vagina: normal appearing vagina with normal color and discharge, no lesions              Cervix: no lesions and IUD string 2 cm              Bimanual Exam:  Uterus:  normal size, contour, position, consistency, mobility, non-tender and anteverted              Adnexa: no mass, fullness, tenderness               Chaperone was present for exam.  ASSESSMENT IUD check, doing well    PLAN Routine f/u   An After Visit Summary was printed and given to the patient.

## 2016-02-20 DIAGNOSIS — Z0000 Encounter for general adult medical examination without abnormal findings: Secondary | ICD-10-CM | POA: Diagnosis not present

## 2016-02-27 DIAGNOSIS — H5213 Myopia, bilateral: Secondary | ICD-10-CM | POA: Diagnosis not present

## 2016-03-03 ENCOUNTER — Other Ambulatory Visit (INDEPENDENT_AMBULATORY_CARE_PROVIDER_SITE_OTHER): Payer: 59

## 2016-03-03 ENCOUNTER — Telehealth: Payer: Self-pay | Admitting: Obstetrics and Gynecology

## 2016-03-03 ENCOUNTER — Other Ambulatory Visit: Payer: Self-pay | Admitting: *Deleted

## 2016-03-03 DIAGNOSIS — R1013 Epigastric pain: Secondary | ICD-10-CM

## 2016-03-03 DIAGNOSIS — Z0000 Encounter for general adult medical examination without abnormal findings: Secondary | ICD-10-CM

## 2016-03-03 LAB — CBC
HCT: 40 % (ref 35.0–45.0)
Hemoglobin: 13.6 g/dL (ref 11.7–15.5)
MCH: 30.8 pg (ref 27.0–33.0)
MCHC: 34 g/dL (ref 32.0–36.0)
MCV: 90.7 fL (ref 80.0–100.0)
MPV: 10.4 fL (ref 7.5–12.5)
PLATELETS: 214 10*3/uL (ref 140–400)
RBC: 4.41 MIL/uL (ref 3.80–5.10)
RDW: 12.9 % (ref 11.0–15.0)
WBC: 5.2 10*3/uL (ref 3.8–10.8)

## 2016-03-03 NOTE — Telephone Encounter (Signed)
The patient has been having long term issues with upper abdominal pain after eating, worse with certain foods. No relief with OTC medication.  She was also just seen at the optometrist who saw fatty deposits in her eyes and recommended she get her cholesterol checked.  Will check FLP, CMP, CBC, vit D Will refer to GI

## 2016-03-04 LAB — COMPLETE METABOLIC PANEL WITH GFR
ALT: 7 U/L (ref 6–29)
AST: 12 U/L (ref 10–30)
Albumin: 4.5 g/dL (ref 3.6–5.1)
Alkaline Phosphatase: 44 U/L (ref 33–115)
BILIRUBIN TOTAL: 0.6 mg/dL (ref 0.2–1.2)
BUN: 10 mg/dL (ref 7–25)
CO2: 25 mmol/L (ref 20–31)
Calcium: 9.6 mg/dL (ref 8.6–10.2)
Chloride: 105 mmol/L (ref 98–110)
Creat: 0.68 mg/dL (ref 0.50–1.10)
Glucose, Bld: 81 mg/dL (ref 65–99)
POTASSIUM: 4.3 mmol/L (ref 3.5–5.3)
Sodium: 138 mmol/L (ref 135–146)
TOTAL PROTEIN: 7.2 g/dL (ref 6.1–8.1)

## 2016-03-04 LAB — LIPID PANEL
CHOLESTEROL: 149 mg/dL (ref 125–200)
HDL: 70 mg/dL (ref >=46)
LDL Cholesterol: 72 mg/dL (ref <130)
TRIGLYCERIDES: 35 mg/dL (ref <150)
Total CHOL/HDL Ratio: 2.1 Ratio (ref <=5.0)
VLDL: 7 mg/dL (ref <30)

## 2016-03-04 LAB — VITAMIN D 25 HYDROXY (VIT D DEFICIENCY, FRACTURES): Vit D, 25-Hydroxy: 40 ng/mL (ref 30–100)

## 2016-03-05 ENCOUNTER — Encounter: Payer: Self-pay | Admitting: Gastroenterology

## 2016-03-11 ENCOUNTER — Telehealth: Payer: Self-pay | Admitting: Emergency Medicine

## 2016-03-11 DIAGNOSIS — K5900 Constipation, unspecified: Secondary | ICD-10-CM | POA: Diagnosis not present

## 2016-03-11 DIAGNOSIS — R1013 Epigastric pain: Secondary | ICD-10-CM

## 2016-03-11 DIAGNOSIS — R140 Abdominal distension (gaseous): Secondary | ICD-10-CM | POA: Diagnosis not present

## 2016-03-11 DIAGNOSIS — R1033 Periumbilical pain: Secondary | ICD-10-CM | POA: Diagnosis not present

## 2016-03-11 NOTE — Telephone Encounter (Signed)
Incoming call from Dr. Oscar LaJertson.  Patient has referral to GI, consult not scheduled until 04/2016.  Requested that referral to Dr. Loreta AveMann be processed for patient who is having acute upper abdominal pain after eating, worse with certain foods.

## 2016-03-11 NOTE — Telephone Encounter (Signed)
Patient is scheduled for today at 1545 with Dr. Loreta AveMann.  She is aware.  Routing to provider for final review. Patient agreeable to disposition. Will close encounter.

## 2016-03-23 ENCOUNTER — Other Ambulatory Visit: Payer: 59

## 2016-04-30 ENCOUNTER — Ambulatory Visit: Payer: 59 | Admitting: Gastroenterology

## 2016-06-28 ENCOUNTER — Other Ambulatory Visit: Payer: Self-pay | Admitting: Obstetrics and Gynecology

## 2016-06-28 MED ORDER — CITALOPRAM HYDROBROMIDE 20 MG PO TABS
ORAL_TABLET | ORAL | 1 refills | Status: DC
Start: 2016-06-28 — End: 2016-11-10

## 2016-06-28 NOTE — Progress Notes (Signed)
Patient's husband just diagnosed with Huntington's Disease, she is very stressed and on edge. We have talked about starting an SSRI before, now she would like to start. Called in Celexa, start with 10 mg a day, increase to 20 mg in 1 week if tolerating. F/U in 1 month.

## 2016-11-10 ENCOUNTER — Encounter: Payer: Self-pay | Admitting: Obstetrics and Gynecology

## 2016-11-10 ENCOUNTER — Ambulatory Visit (INDEPENDENT_AMBULATORY_CARE_PROVIDER_SITE_OTHER): Payer: 59 | Admitting: Obstetrics and Gynecology

## 2016-11-10 VITALS — BP 110/60 | HR 84 | Resp 16 | Ht 67.0 in | Wt 138.0 lb

## 2016-11-10 DIAGNOSIS — Z30431 Encounter for routine checking of intrauterine contraceptive device: Secondary | ICD-10-CM

## 2016-11-10 DIAGNOSIS — Z01419 Encounter for gynecological examination (general) (routine) without abnormal findings: Secondary | ICD-10-CM

## 2016-11-10 NOTE — Patient Instructions (Signed)

## 2016-11-10 NOTE — Progress Notes (Addendum)
36 y.o. [id number] MarriedCaucasianF here for annual exam.  Mirena IUD placed in 1/17, no menses. No dyspareunia.     No LMP recorded. Patient is not currently having periods (Reason: IUD).          Sexually active: Yes.    The current method of family planning is IUD.    Exercising: Yes.    The patient has a physically strenuous job, but has no regular exercise apart from work.  Smoker:  no  Health Maintenance: Pap:  10/2015 Normal, negative hpv History of abnormal Pap:  no MMG:  Never TDaP:  2012 Gardasil: N/A   reports that she has never smoked. She has never used smokeless tobacco. She reports that she does not drink alcohol or use drugs.  Past Medical History:  Diagnosis Date  . Bilateral ovarian cysts   . Endometriosis     Past Surgical History:  Procedure Laterality Date  . DIAGNOSTIC LAPAROSCOPY  2002   Ovarian Cysts - Endometriosis  . INTRAUTERINE DEVICE INSERTION  2012   [facility]  . TOOTH EXTRACTION  2007    Current Outpatient Prescriptions  Medication Sig Dispense Refill  . levonorgestrel (MIRENA) 20 MCG/24HR IUD 1 each by Intrauterine route once. Inserted 10/31/15     No current facility-administered medications for this visit.     Family History  Problem Relation Age of Onset  . Cancer Mother     Lung Cancer  . Stroke Mother   . Depression Mother   . COPD Mother   . Heart disease Mother   . Hypertension Mother   . Learning disabilities Mother   . Heart disease Maternal Grandmother   . Heart disease Maternal Grandfather   . Cancer Maternal Grandfather     Lung Cancer    Review of Systems  Constitutional: Negative.   HENT: Negative.   Eyes: Negative.   Respiratory: Negative.   Cardiovascular: Negative.   Gastrointestinal: Negative.   Endocrine: Negative.   Genitourinary: Negative.   Musculoskeletal: Negative.   Skin: Negative.   Allergic/Immunologic: Negative.   Neurological: Negative.   Hematological: Negative.   Psychiatric/Behavioral:  Negative.     Exam:   BP 110/60 (BP Location: Right Arm, Patient Position: Sitting, Cuff Size: Normal)   Pulse 84   Resp 16   Ht 5\' 7"  (1.702 m)   Wt 138 lb (62.6 kg)   BMI 21.61 kg/m   Weight change: @WEIGHTCHANGE @ Height:   Height: 5\' 7"  (170.2 cm)  Ht Readings from Last 3 Encounters:  11/10/16 5\' 7"  (1.702 m)  12/09/15 5\' 7"  (1.702 m)  10/31/15 5\' 7"  (1.702 m)    General appearance: alert, cooperative and appears stated age Head: Normocephalic, without obvious abnormality, atraumatic Neck: no adenopathy, supple, symmetrical, trachea midline and thyroid normal to inspection and palpation Lungs: clear to auscultation bilaterally Cardiovascular: regular rate and rhythm Breasts: normal appearance, no masses or tenderness Heart: regular rate and rhythm Abdomen: soft, non-tender; bowel sounds normal; no masses,  no organomegaly Extremities: extremities normal, atraumatic, no cyanosis or edema Skin: Skin color, texture, turgor normal. No rashes or lesions Lymph nodes: Cervical, supraclavicular, and axillary nodes normal. No abnormal inguinal nodes palpated Neurologic: Grossly normal   Pelvic: External genitalia:  no lesions              Urethra:  normal appearing urethra with no masses, tenderness or lesions              Bartholins and Skenes: normal  Vagina: normal appearing vagina with normal color and discharge, no lesions              Cervix: no lesions, IUD string 1-1.5 cm               Bimanual Exam:  Uterus:  normal size, contour, position, consistency, mobility, non-tender              Adnexa: no mass, fullness, tenderness               Rectovaginal: Confirms               Anus:  normal sphincter tone, no lesions  Chaperone was present for exam.  A:  Well Woman with normal exam  IUD check IUD in place, strings slightly shorter than last year.   P:   Normal screening labs last May  No pap this year  IUD due for removal in 1/22  Discussed breast  self exam  Discussed calcium and vit D intake     11/11/16 Addendum: Limited ultrasound, IUD in place.

## 2017-01-12 ENCOUNTER — Other Ambulatory Visit: Payer: Self-pay | Admitting: Obstetrics and Gynecology

## 2017-01-12 MED ORDER — SERTRALINE HCL 50 MG PO TABS: ORAL_TABLET | ORAL | 1 refills | Status: DC

## 2017-01-12 NOTE — Progress Notes (Signed)
Patient struggling with depression. Will start Zoloft

## 2017-02-28 ENCOUNTER — Other Ambulatory Visit: Payer: Self-pay | Admitting: Obstetrics and Gynecology

## 2017-02-28 MED ORDER — SERTRALINE HCL 50 MG PO TABS
ORAL_TABLET | ORAL | 2 refills | Status: DC
Start: 2017-02-28 — End: 2017-07-06

## 2017-03-11 DIAGNOSIS — L821 Other seborrheic keratosis: Secondary | ICD-10-CM | POA: Diagnosis not present

## 2017-03-11 DIAGNOSIS — L570 Actinic keratosis: Secondary | ICD-10-CM | POA: Diagnosis not present

## 2017-03-11 DIAGNOSIS — L814 Other melanin hyperpigmentation: Secondary | ICD-10-CM | POA: Diagnosis not present

## 2017-03-22 MED FILL — IMIQUIMOD 5% CREAM PACKET: 5 | 28 days supply | Qty: 12 | Fill #0

## 2017-03-25 MED FILL — SERTRALINE HCL 50 MG TABLET: 50 | 90 days supply | Qty: 90 | Fill #0

## 2017-07-06 ENCOUNTER — Other Ambulatory Visit: Payer: Self-pay

## 2017-07-06 ENCOUNTER — Encounter: Payer: Self-pay | Admitting: Obstetrics and Gynecology

## 2017-07-06 ENCOUNTER — Ambulatory Visit (INDEPENDENT_AMBULATORY_CARE_PROVIDER_SITE_OTHER): Payer: 59 | Admitting: Obstetrics and Gynecology

## 2017-07-06 VITALS — BP 90/56 | HR 80 | Resp 14 | Ht 67.0 in | Wt 140.0 lb

## 2017-07-06 DIAGNOSIS — N644 Mastodynia: Secondary | ICD-10-CM | POA: Diagnosis not present

## 2017-07-06 DIAGNOSIS — N6320 Unspecified lump in the left breast, unspecified quadrant: Secondary | ICD-10-CM

## 2017-07-06 DIAGNOSIS — N6323 Unspecified lump in the left breast, lower outer quadrant: Secondary | ICD-10-CM | POA: Diagnosis not present

## 2017-07-06 NOTE — Progress Notes (Signed)
GYNECOLOGY  VISIT   HPI: 36 y.o.   Married  Caucasian  female   G2P2002 with No LMP recorded. Patient is not currently having periods (Reason: IUD).   here c/o a 6 day h/o left breast pain, not sure if there is a lump. No h/o trauma, no strenuous activity. No increase in caffeine intake. Not having cycles.   GYNECOLOGIC HISTORY: No LMP recorded. Patient is not currently having periods (Reason: IUD). Contraception: IUD Menopausal hormone therapy: none        OB History    Gravida Para Term Preterm AB Living   0 0 2   SAB TAB Ectopic Multiple Live Births   0 0 0 0 2         Patient Active Problem List   Diagnosis Date Noted  . Impaired fasting glucose 10/20/2013  . IBS (irritable bowel syndrome) 10/20/2013  . Otalgia 10/20/2013  . Routine general medical examination at a health care facility 10/15/2013  . Impacted cerumen 09/18/2013  . Acne 08/26/2013  . Feeling grief 08/26/2013  . Irritation of eye 08/25/2013  . Acute upper respiratory infections of unspecified site 08/25/2013    Past Medical History:  Diagnosis Date  . Bilateral ovarian cysts   . Endometriosis     Past Surgical History:  Procedure Laterality Date  . DIAGNOSTIC LAPAROSCOPY  2002   Ovarian Cysts - Endometriosis  . INTRAUTERINE DEVICE INSERTION  2012   [facility]  . TOOTH EXTRACTION  2007    Current Outpatient Prescriptions  Medication Sig Dispense Refill  . levonorgestrel (MIRENA) 20 MCG/24HR IUD 1 each by Intrauterine route once. Inserted 10/31/15     No current facility-administered medications for this visit.      ALLERGIES: Patient has no known allergies.  Family History  Problem Relation Age of Onset  . Cancer Mother        Lung Cancer  . Stroke Mother   . Depression Mother   . COPD Mother   . Heart disease Mother   . Hypertension Mother   . Learning disabilities Mother   . Heart disease Maternal Grandmother   . Heart disease Maternal Grandfather   . Cancer Maternal  Grandfather        Lung Cancer    Social History   Social History  . Marital status: Married    Spouse name: Smrithi Pigford  . Number of children: 2  . Years of education: 14   Occupational History  . MANAGER/CMA  [employer]   Social History Main Topics  . Smoking status: Never Smoker  . Smokeless tobacco: Never Used  . Alcohol use No  . Drug use: No  . Sexual activity: Yes    Partners: Male    Birth control/ protection: IUD     Comment: Mirena Inserted 10/31/15   Other Topics Concern  . 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   HENT: Negative.   Eyes: Negative.  Respiratory: Negative.   Cardiovascular: Negative.   Gastrointestinal: Negative.   Genitourinary: Negative.   Musculoskeletal: Negative.   Skin: Negative.   Neurological: Negative.   Endo/Heme/Allergies: Negative.   Psychiatric/Behavioral: Negative.     PHYSICAL EXAMINATION:    BP (!) 90/56 (BP Location: Right Arm, Patient Position: Sitting, Cuff Size: Normal)   Pulse 80   Resp 14   Ht  (1.702 m)   Wt 140 lb (63.5 kg)   BMI 21.93 kg/m     General appearance: alert, cooperative and appears stated age Breasts: tender in the lateral aspect of the left breast. There is a subtle bean shaped tender lump at 4 o'clock in the left breast. Approximately 1.5 cm. No right breast lumps  No axillary adenopathy.  ASSESSMENT Mastalgia Left breast lump    PLAN Diagnostic breast imaging   An After Visit Summary was printed and given to the  patient.

## 2017-07-07 ENCOUNTER — Ambulatory Visit
Admission: RE | Admit: 2017-07-07 | Discharge: 2017-07-07 | Disposition: A | Discharge status: Home / Self Care | Payer: 59 | Source: Ambulatory Visit | Attending: Obstetrics and Gynecology | Admitting: Obstetrics and Gynecology

## 2017-07-07 DIAGNOSIS — R922 Inconclusive mammogram: Secondary | ICD-10-CM | POA: Diagnosis not present

## 2017-07-07 DIAGNOSIS — N6320 Unspecified lump in the left breast, unspecified quadrant: Secondary | ICD-10-CM

## 2017-07-07 DIAGNOSIS — N644 Mastodynia: Secondary | ICD-10-CM | POA: Diagnosis not present

## 2017-09-15 MED FILL — SERTRALINE HCL 50 MG TABLET: 50 | 90 days supply | Qty: 90 | Fill #1

## 2017-11-08 DIAGNOSIS — J029 Acute pharyngitis, unspecified: Secondary | ICD-10-CM | POA: Diagnosis not present

## 2017-11-08 DIAGNOSIS — R6889 Other general symptoms and signs: Secondary | ICD-10-CM | POA: Diagnosis not present

## 2017-11-09 DIAGNOSIS — J3501 Chronic tonsillitis: Secondary | ICD-10-CM | POA: Diagnosis not present

## 2017-12-14 ENCOUNTER — Other Ambulatory Visit: Payer: Self-pay | Admitting: Obstetrics and Gynecology

## 2017-12-14 ENCOUNTER — Other Ambulatory Visit (INDEPENDENT_AMBULATORY_CARE_PROVIDER_SITE_OTHER): Payer: 59

## 2017-12-14 DIAGNOSIS — Z0000 Encounter for general adult medical examination without abnormal findings: Secondary | ICD-10-CM | POA: Diagnosis not present

## 2017-12-14 DIAGNOSIS — R5383 Other fatigue: Secondary | ICD-10-CM

## 2017-12-15 LAB — CBC
HEMATOCRIT: 39.9 % (ref 34.0–46.6)
HEMOGLOBIN: 13.4 g/dL (ref 11.1–15.9)
MCH: 30.8 pg (ref 26.6–33.0)
MCHC: 33.6 g/dL (ref 31.5–35.7)
MCV: 92 fL (ref 79–97)
Platelets: 242 10*3/uL (ref 150–379)
RBC: 4.35 x10E6/uL (ref 3.77–5.28)
RDW: 12.6 % (ref 12.3–15.4)
WBC: 4.9 10*3/uL (ref 3.4–10.8)

## 2017-12-15 LAB — COMPREHENSIVE METABOLIC PANEL
ALT: 14 IU/L (ref 0–32)
AST: 15 IU/L (ref 0–40)
Albumin/Globulin Ratio: 1.8 (ref 1.2–2.2)
Albumin: 4.6 g/dL (ref 3.5–5.5)
Alkaline Phosphatase: 51 IU/L (ref 39–117)
BUN/Creatinine Ratio: 18 (ref 9–23)
BUN: 11 mg/dL (ref 6–20)
Bilirubin Total: 0.4 mg/dL (ref 0.0–1.2)
CALCIUM: 9.1 mg/dL (ref 8.7–10.2)
CO2: 26 mmol/L (ref 20–29)
Chloride: 102 mmol/L (ref 96–106)
Creatinine, Ser: 0.6 mg/dL (ref 0.57–1.00)
GFR calc Af Amer: 135 mL/min/{1.73_m2} (ref >59)
GFR, EST NON AFRICAN AMERICAN: 117 mL/min/{1.73_m2} (ref >59)
Globulin, Total: 2.5 g/dL (ref 1.5–4.5)
Glucose: 86 mg/dL (ref 65–99)
Potassium: 3.9 mmol/L (ref 3.5–5.2)
Sodium: 141 mmol/L (ref 134–144)
Total Protein: 7.1 g/dL (ref 6.0–8.5)

## 2017-12-15 LAB — LIPID PANEL
CHOL/HDL RATIO: 2.2 ratio (ref 0.0–4.4)
Cholesterol, Total: 135 mg/dL (ref 100–199)
HDL: 62 mg/dL (ref >39)
LDL CALC: 67 mg/dL (ref 0–99)
Triglycerides: 32 mg/dL (ref 0–149)
VLDL Cholesterol Cal: 6 mg/dL (ref 5–40)

## 2017-12-15 LAB — TSH: TSH: 1.02 u[IU]/mL (ref 0.450–4.500)

## 2017-12-16 DIAGNOSIS — H5213 Myopia, bilateral: Secondary | ICD-10-CM | POA: Diagnosis not present

## 2017-12-16 DIAGNOSIS — H52221 Regular astigmatism, right eye: Secondary | ICD-10-CM | POA: Diagnosis not present

## 2017-12-16 DIAGNOSIS — J029 Acute pharyngitis, unspecified: Secondary | ICD-10-CM | POA: Diagnosis not present

## 2017-12-27 ENCOUNTER — Ambulatory Visit (INDEPENDENT_AMBULATORY_CARE_PROVIDER_SITE_OTHER): Payer: 59 | Admitting: Obstetrics and Gynecology

## 2017-12-27 ENCOUNTER — Other Ambulatory Visit: Payer: Self-pay

## 2017-12-27 ENCOUNTER — Encounter: Payer: Self-pay | Admitting: Obstetrics and Gynecology

## 2017-12-27 VITALS — BP 92/56 | HR 88 | Resp 14 | Ht 66.75 in | Wt 144.0 lb

## 2017-12-27 DIAGNOSIS — R4586 Emotional lability: Secondary | ICD-10-CM | POA: Diagnosis not present

## 2017-12-27 DIAGNOSIS — Z01419 Encounter for gynecological examination (general) (routine) without abnormal findings: Secondary | ICD-10-CM

## 2017-12-27 DIAGNOSIS — Z30431 Encounter for routine checking of intrauterine contraceptive device: Secondary | ICD-10-CM

## 2017-12-27 MED ORDER — BUPROPION HCL ER (XL) 150 MG PO TB24: 150.0000 mg | ORAL_TABLET | Freq: Every day | ORAL | 1 refills | Status: DC

## 2017-12-27 MED FILL — buPROPion HCL ER (XL) 150 M: 150 | 30 days supply | Qty: 30 | Fill #0

## 2017-12-27 NOTE — Progress Notes (Signed)
37 y.o. [id number] MarriedCaucasianF here for annual exam.  Mirena IUD placed in 1/17, no menses. No dyspareunia.  She was on Zoloft for depression, made her sleepy. She is intermittently down. Mood changes, more irritable.     No LMP recorded. Patient is not currently having periods (Reason: IUD).          Sexually active: Yes.    The current method of family planning is IUD.    Exercising: Yes.    classes 2-3 x weekly  Smoker:  No  Health Maintenance: Pap:  10/31/15 neg. HR HPV:neg  History of abnormal Pap:  no MMG: 07/07/17 US left BIRADS1:Neg  Colonoscopy:  n/a BMD:   n/a TDaP:  2012 Gardasil: n/a   reports that  has never smoked. she has never used smokeless tobacco. She reports that she does not drink alcohol or use drugs.  Past Medical History:  Diagnosis Date  . Bilateral ovarian cysts   . Endometriosis     Past Surgical History:  Procedure Laterality Date  . DIAGNOSTIC LAPAROSCOPY  2002   Ovarian Cysts - Endometriosis  . INTRAUTERINE DEVICE INSERTION  2012   [facility]  . TOOTH EXTRACTION  2007    Current Outpatient Medications  Medication Sig Dispense Refill  . levonorgestrel (MIRENA) 20 MCG/24HR IUD 1 each by Intrauterine route once. Inserted 10/31/15    . omeprazole (PRILOSEC) 40 MG capsule Take 1 capsule by mouth daily as needed.     No current facility-administered medications for this visit.     Family History  Problem Relation Age of Onset  . Cancer Mother        Lung Cancer  . Stroke Mother   . Depression Mother   . COPD Mother   . Heart disease Mother   . Hypertension Mother   . Learning disabilities Mother   . Heart disease Maternal Grandmother   . Heart disease Maternal Grandfather   . Cancer Maternal Grandfather        Lung Cancer    Review of Systems  All other systems reviewed and are negative.   Exam:   BP (!) 92/56 (BP Location: Left Arm, Patient Position: Sitting, Cuff Size: Normal)   Pulse 88   Resp 14   Ht 5' 6.75" (1.695 m)    Wt 144 lb (65.3 kg)   BMI 22.72 kg/m   Weight change: @WEIGHTCHANGE @ Height:   Height: 5' 6.75" (169.5 cm)  Ht Readings from Last 3 Encounters:  12/27/17 5' 6.75" (1.695 m)  07/06/17 5\' 7"  (1.702 m)  11/10/16 5\' 7"  (1.702 m)    General appearance: alert, cooperative and appears stated age Head: Normocephalic, without obvious abnormality, atraumatic Neck: no adenopathy, supple, symmetrical, trachea midline and thyroid normal to inspection and palpation Lungs: clear to auscultation bilaterally Cardiovascular: regular rate and rhythm Breasts: normal appearance, no masses or tenderness Abdomen: soft, non-tender; non distended,  no masses,  no organomegaly Extremities: extremities normal, atraumatic, no cyanosis or edema Skin: Skin color, texture, turgor normal. No rashes or lesions Lymph nodes: Cervical, supraclavicular, and axillary nodes normal. No abnormal inguinal nodes palpated Neurologic: Grossly normal   Pelvic: External genitalia:  no lesions              Urethra:  normal appearing urethra with no masses, tenderness or lesions              Bartholins and Skenes: normal                 Vagina:  normal appearing vagina with normal color and discharge, no lesions              Cervix: no lesions and IUD string 1+ cm               Bimanual Exam:  Uterus:  normal size, contour, position, consistency, mobility, non-tender and anteverted              Adnexa: no mass, fullness, tenderness               Rectovaginal: declined  Chaperone was present for exam.  A:  Well Woman with normal exam  IUD check  Mood changes, didn't like Celexa or Zoloft  P:   No pap this year  Try Wellbutrin  Screening labs recently normal  She is doing breast self exams  Aware of calcium/vit d requirements

## 2018-01-04 DIAGNOSIS — S60511A Abrasion of right hand, initial encounter: Secondary | ICD-10-CM | POA: Diagnosis not present

## 2018-01-04 DIAGNOSIS — S8992XA Unspecified injury of left lower leg, initial encounter: Secondary | ICD-10-CM | POA: Diagnosis not present

## 2018-01-04 DIAGNOSIS — W108XXA Fall (on) (from) other stairs and steps, initial encounter: Secondary | ICD-10-CM | POA: Diagnosis not present

## 2018-01-04 DIAGNOSIS — S80211A Abrasion, right knee, initial encounter: Secondary | ICD-10-CM | POA: Diagnosis not present

## 2018-01-04 DIAGNOSIS — Y999 Unspecified external cause status: Secondary | ICD-10-CM | POA: Diagnosis not present

## 2018-01-04 DIAGNOSIS — Y93K1 Activity, walking an animal: Secondary | ICD-10-CM | POA: Diagnosis not present

## 2018-01-04 DIAGNOSIS — M25562 Pain in left knee: Secondary | ICD-10-CM | POA: Diagnosis not present

## 2018-01-04 DIAGNOSIS — S80212A Abrasion, left knee, initial encounter: Secondary | ICD-10-CM | POA: Diagnosis not present

## 2018-01-04 DIAGNOSIS — Z23 Encounter for immunization: Secondary | ICD-10-CM | POA: Diagnosis not present

## 2018-01-05 DIAGNOSIS — M25562 Pain in left knee: Secondary | ICD-10-CM | POA: Diagnosis not present

## 2018-01-12 ENCOUNTER — Other Ambulatory Visit (HOSPITAL_COMMUNITY): Payer: Self-pay | Admitting: Orthopedic Surgery

## 2018-01-12 DIAGNOSIS — M25562 Pain in left knee: Secondary | ICD-10-CM

## 2018-01-20 ENCOUNTER — Ambulatory Visit (HOSPITAL_COMMUNITY): Admission: RE | Admit: 2018-01-20 | Payer: 59 | Source: Ambulatory Visit

## 2018-01-25 ENCOUNTER — Ambulatory Visit (HOSPITAL_COMMUNITY): Payer: 59

## 2018-02-02 DIAGNOSIS — M25562 Pain in left knee: Secondary | ICD-10-CM | POA: Diagnosis not present

## 2018-02-06 IMAGING — MG 2D DIGITAL DIAGNOSTIC BILATERAL MAMMOGRAM WITH CAD AND ADJUNCT T
8 of 12 series · 8 of 28 positions shown · non-contrast
Comparison: None

CLINICAL DATA: 35-year-old patient presents for evaluation of a
palpable area of concern and pain in the 4 o'clock region of the
left breast. She also mentions to us that earlier today she noticed
a palpable area that seemed a asymmetrically large in the region of
the medial left clavicle.

EXAM:
2D DIGITAL DIAGNOSTIC BILATERAL MAMMOGRAM WITH CAD AND ADJUNCT TOMO
ULTRASOUND LEFT BREAST

[L CC synth-2D]
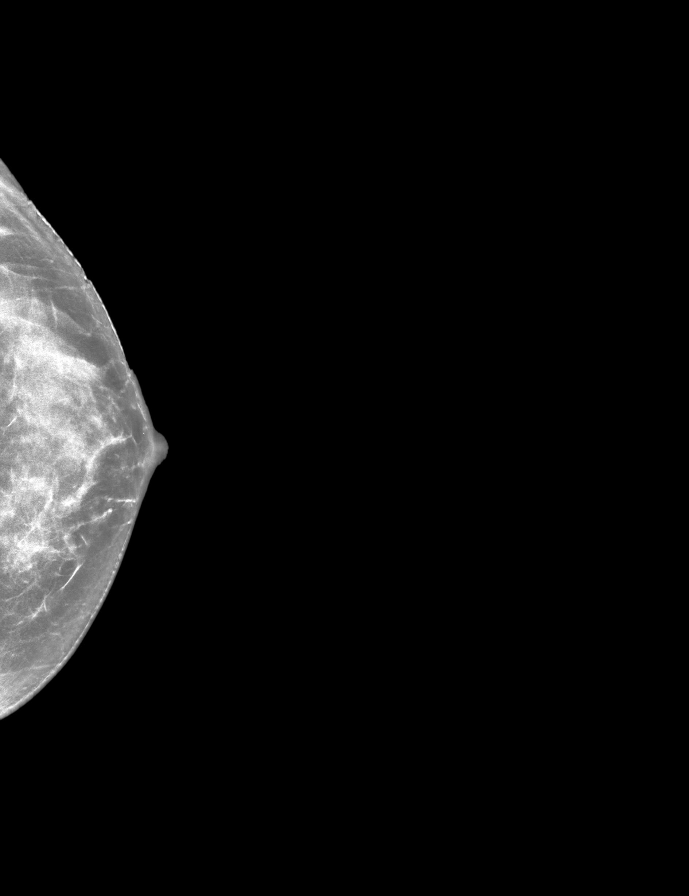

[L MLO synth-2D]
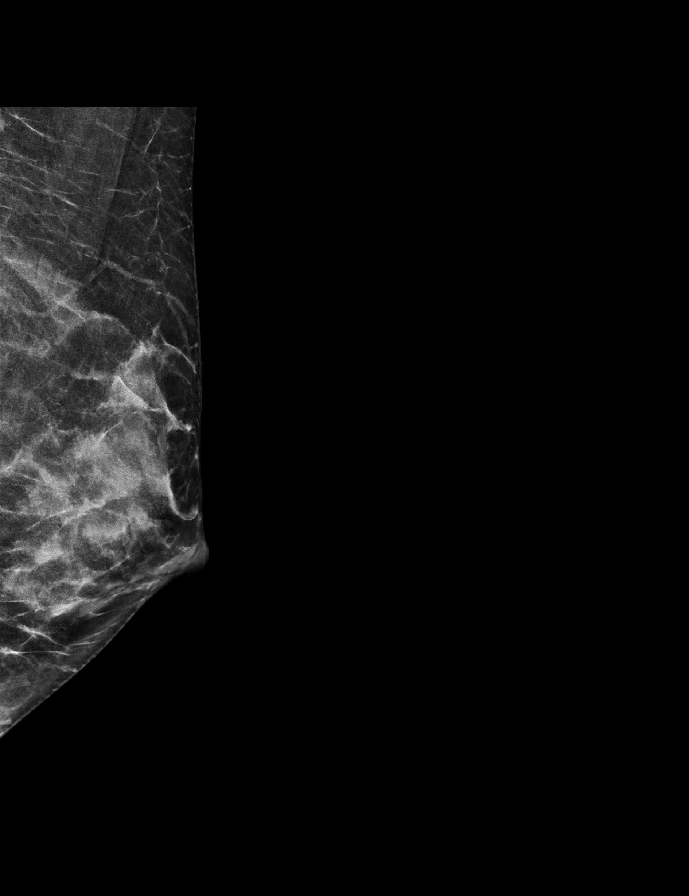

[R CC]
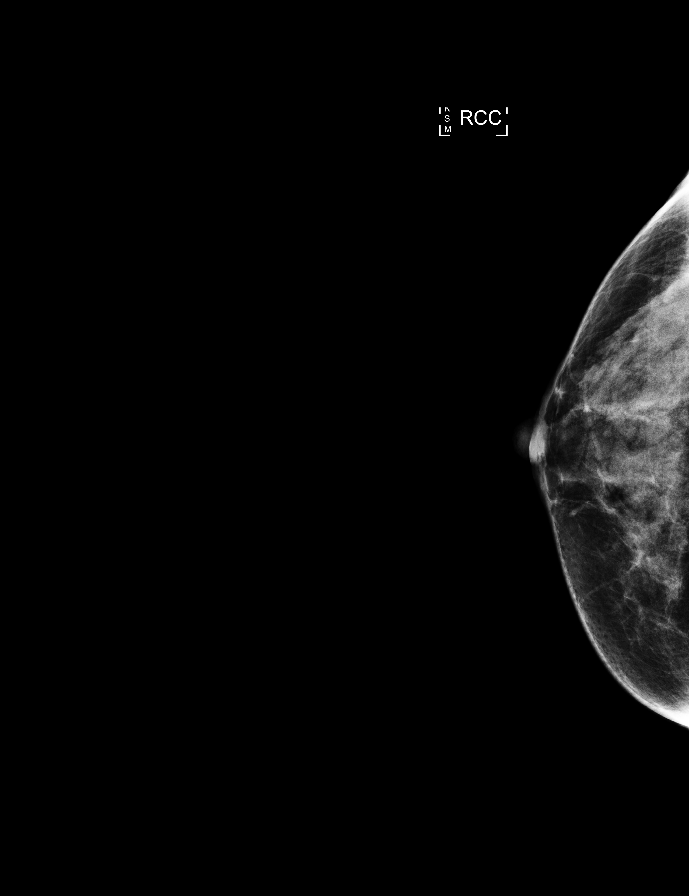

[R MLO synth-2D]
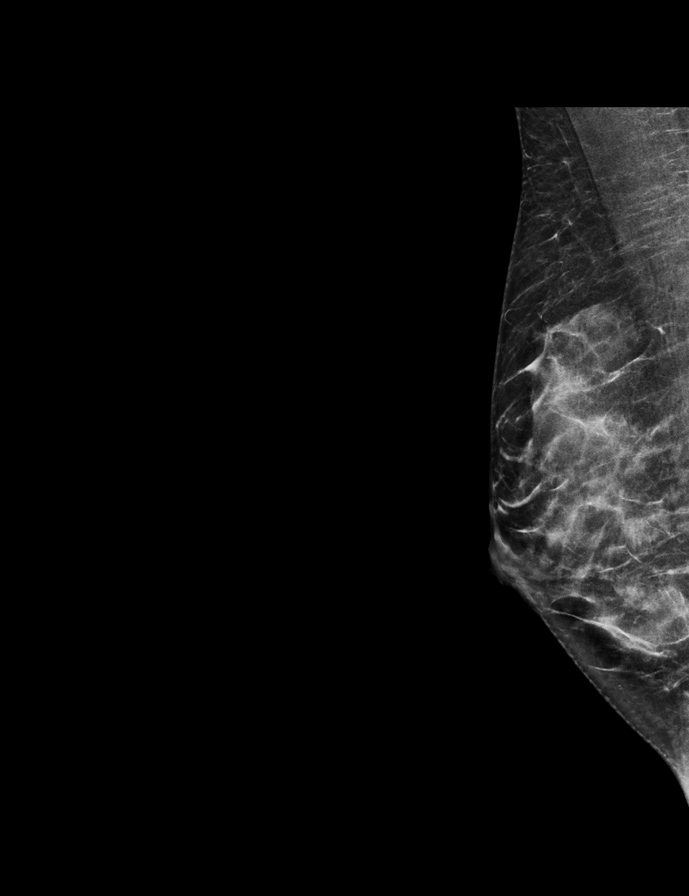

[R CC synth-2D]
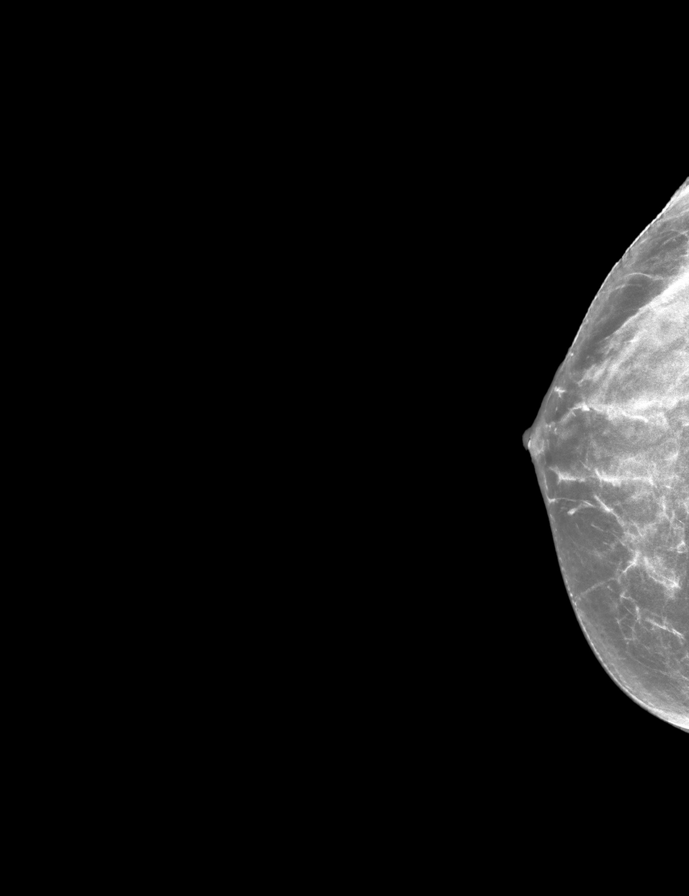

[L MLO]
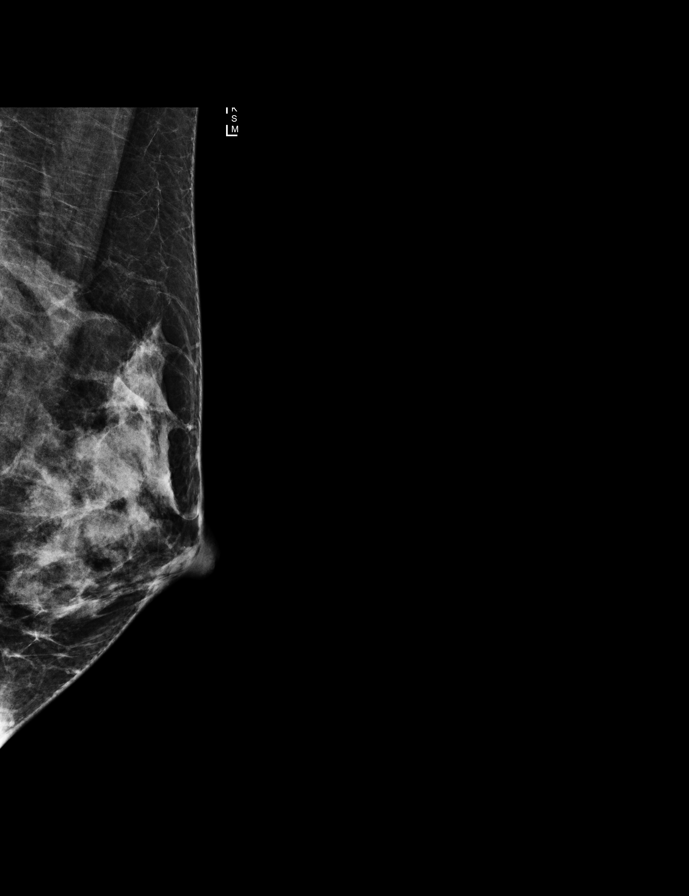

[R MLO]
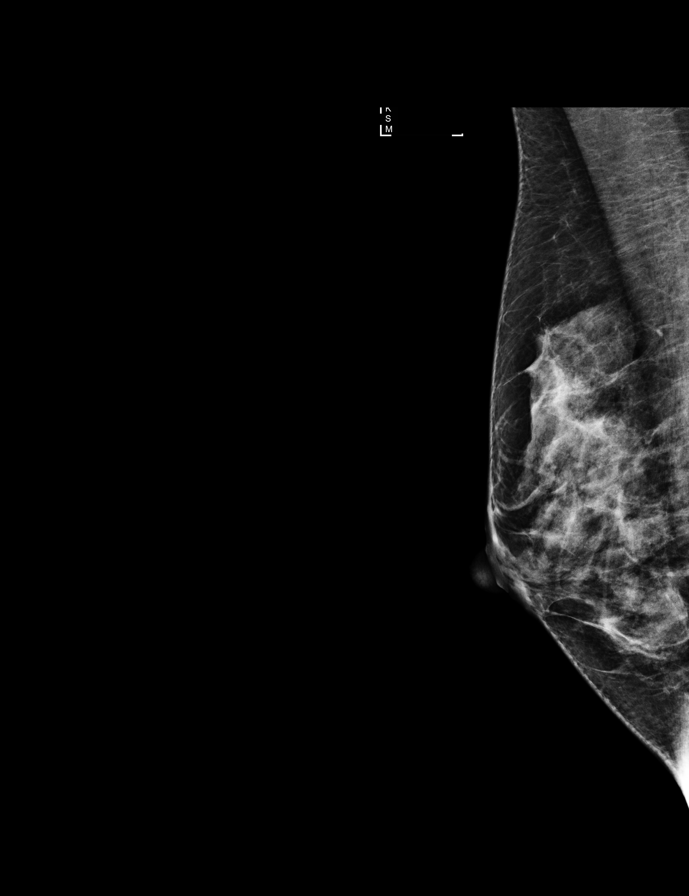

[L CC]
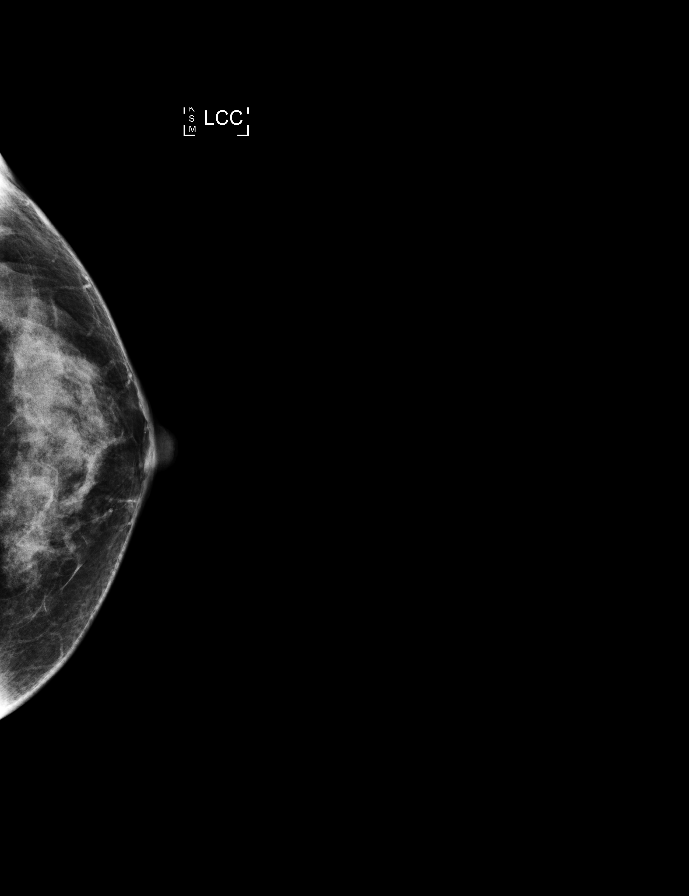

[8 of 28 positions shown; findings below may reference images not displayed]

ACR Breast Density Category c: The breast tissue is heterogeneously
dense, which may obscure small masses.
FINDINGS: No mass, architectural distortion, or suspicious microcalcification
is identified in either breast to suggest malignancy.

Mammographic images were processed with CAD.

On physical exam, the medial margin of the left clavicle does feel
slightly more prominent than the medial margin of the right
clavicle. I do not palpate a mass separate from the clavicle.

Targeted ultrasound is performed, showing normal breast parenchyma
in the region of concern in the left breast at 4 o'clock position.
No solid or cystic mass or abnormal shadowing is identified.
Ultrasound of the medial aspect of the left clavicle shows no
evidence of fluid collection or soft tissue mass. The bony detail is
very limited on ultrasound. Skin thickness is normal.
IMPRESSION: No evidence of malignancy in either breast. Normal mammogram and
ultrasound appearance of the region of patient concern in the 4
o'clock left breast.

I believe the patient is palpating the medial margin of the left
clavicle and no suspicious findings are identified on ultrasound of
this region. Please note that the ultrasound does provide detailed
evaluation of the bone.

RECOMMENDATION:
Screening mammogram at age 39 unless there are persistent or
intervening clinical concerns. (Code:RY-[id number])

I have discussed the findings and recommendations with the patient.
Results were also provided in writing at the conclusion of the
visit. If applicable, a reminder letter will be sent to the patient
regarding the next appointment.

BI-RADS CATEGORY  1: Negative.

## 2018-02-06 IMAGING — US ULTRASOUND LEFT BREAST LIMITED
1 series · 3 of 3 positions shown · non-contrast
Comparison: None

CLINICAL DATA: 35-year-old patient presents for evaluation of a
palpable area of concern and pain in the 4 o'clock region of the
left breast. She also mentions to us that earlier today she noticed
a palpable area that seemed a asymmetrically large in the region of
the medial left clavicle.

EXAM:
2D DIGITAL DIAGNOSTIC BILATERAL MAMMOGRAM WITH CAD AND ADJUNCT TOMO
ULTRASOUND LEFT BREAST

[Series 1: ultrasound left breast limited · 0.04mm/px · 3 of 3 slices shown]
[im 1/3]
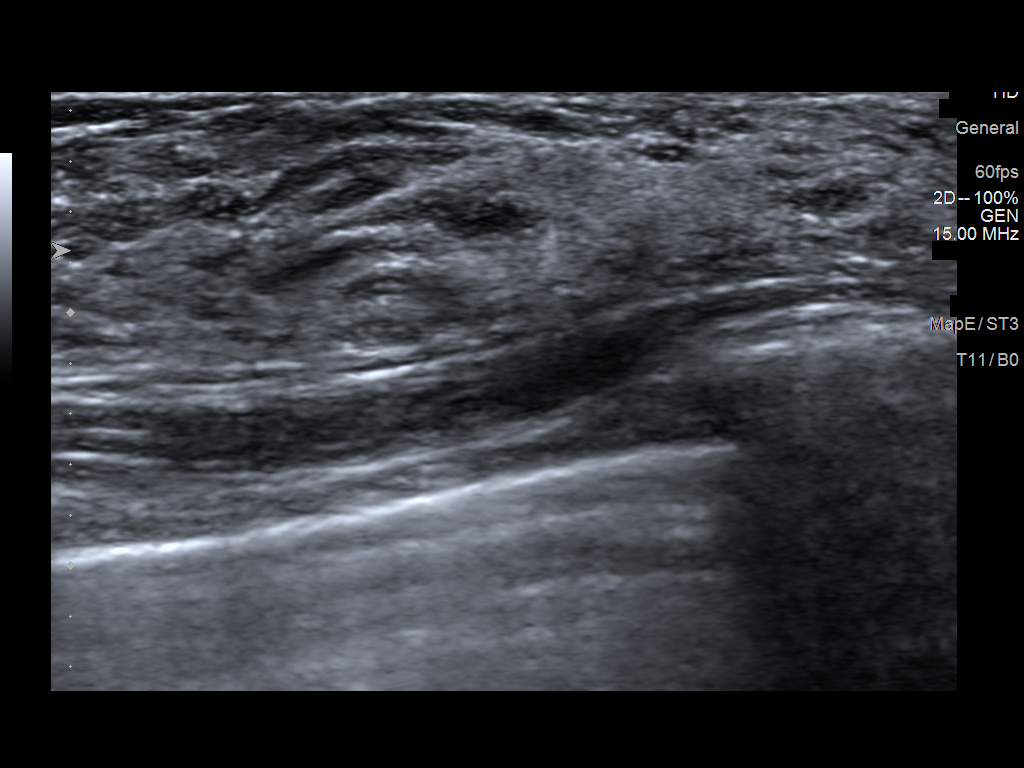
[im 2/3]
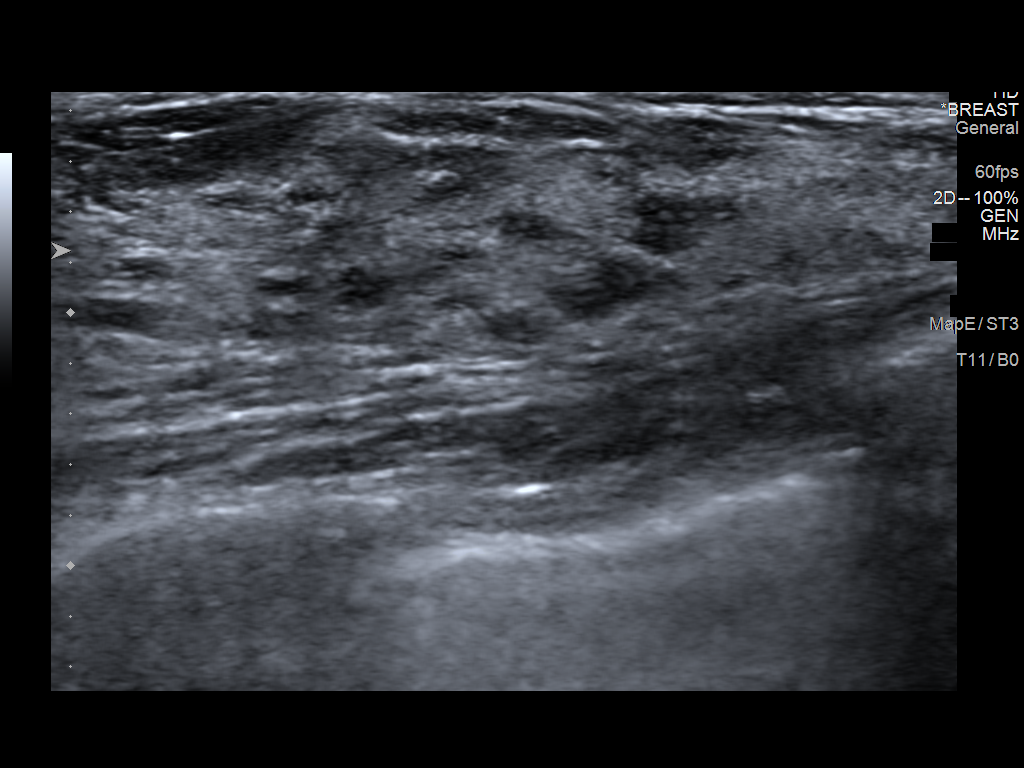
[im 3/3]
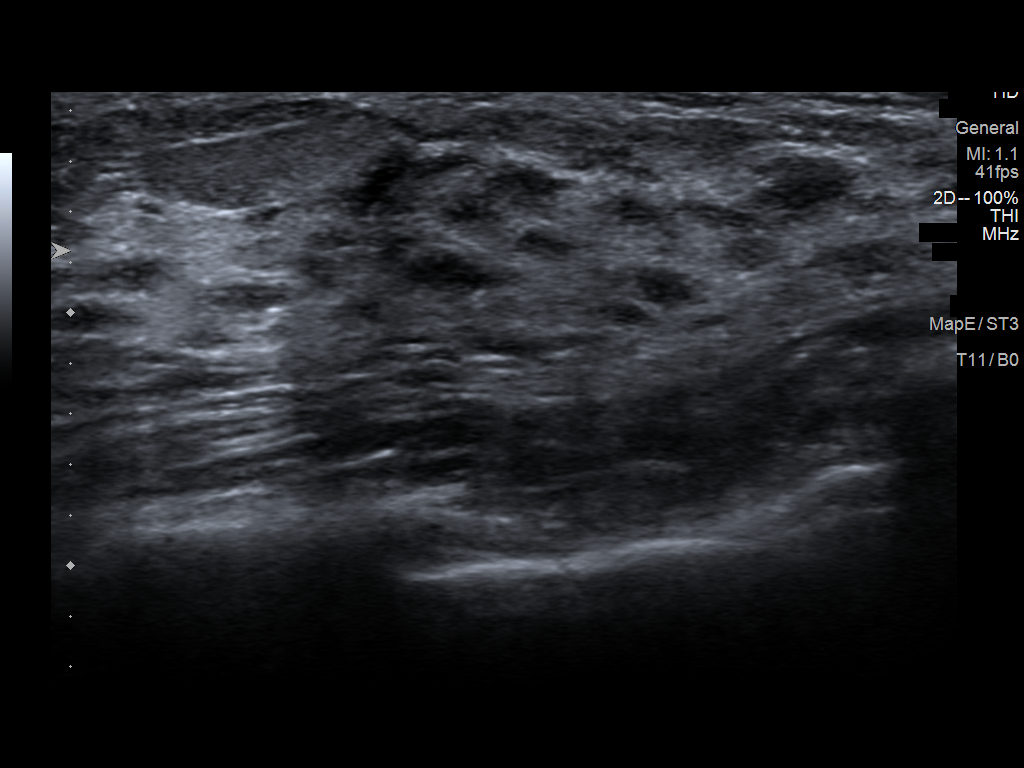

[3 of 3 positions shown; findings below may reference images not displayed]

ACR Breast Density Category c: The breast tissue is heterogeneously
dense, which may obscure small masses.
FINDINGS: No mass, architectural distortion, or suspicious microcalcification
is identified in either breast to suggest malignancy.

Mammographic images were processed with CAD.

On physical exam, the medial margin of the left clavicle does feel
slightly more prominent than the medial margin of the right
clavicle. I do not palpate a mass separate from the clavicle.

Targeted ultrasound is performed, showing normal breast parenchyma
in the region of concern in the left breast at 4 o'clock position.
No solid or cystic mass or abnormal shadowing is identified.
Ultrasound of the medial aspect of the left clavicle shows no
evidence of fluid collection or soft tissue mass. The bony detail is
very limited on ultrasound. Skin thickness is normal.
IMPRESSION: No evidence of malignancy in either breast. Normal mammogram and
ultrasound appearance of the region of patient concern in the 4
o'clock left breast.

I believe the patient is palpating the medial margin of the left
clavicle and no suspicious findings are identified on ultrasound of
this region. Please note that the ultrasound does provide detailed
evaluation of the bone.

RECOMMENDATION:
Screening mammogram at age 39 unless there are persistent or
intervening clinical concerns. (Code:RY-[id number])

I have discussed the findings and recommendations with the patient.
Results were also provided in writing at the conclusion of the
visit. If applicable, a reminder letter will be sent to the patient
regarding the next appointment.

BI-RADS CATEGORY  1: Negative.

## 2018-02-07 DIAGNOSIS — M25562 Pain in left knee: Secondary | ICD-10-CM | POA: Diagnosis not present

## 2018-02-10 MED FILL — buPROPion HCL ER (XL) 150 M: 150 | 30 days supply | Qty: 30 | Fill #1

## 2018-06-23 MED FILL — OMEGA-3 ETHYL ESTER 1 GM CA: 1 | 30 days supply | Qty: 120 | Fill #0

## 2018-06-23 MED FILL — FLUoxetine HCL 20 MG CAPS: 20 | 90 days supply | Qty: 90 | Fill #0

## 2018-06-26 MED FILL — VYVANSE 40 MG CAPSULE: 40 | 30 days supply | Qty: 30 | Fill #0

## 2018-09-13 DIAGNOSIS — F988 Other specified behavioral and emotional disorders with onset usually occurring in childhood and adolescence: Secondary | ICD-10-CM | POA: Diagnosis not present

## 2018-09-13 DIAGNOSIS — F419 Anxiety disorder, unspecified: Secondary | ICD-10-CM | POA: Diagnosis not present

## 2018-09-13 DIAGNOSIS — F329 Major depressive disorder, single episode, unspecified: Secondary | ICD-10-CM | POA: Diagnosis not present

## 2018-09-13 MED FILL — VYVANSE 40 MG CAPSULE: 40 | 30 days supply | Qty: 30 | Fill #0

## 2019-01-16 DIAGNOSIS — F419 Anxiety disorder, unspecified: Secondary | ICD-10-CM | POA: Diagnosis not present

## 2019-01-16 DIAGNOSIS — J4520 Mild intermittent asthma, uncomplicated: Secondary | ICD-10-CM | POA: Diagnosis not present

## 2019-01-16 DIAGNOSIS — Z23 Encounter for immunization: Secondary | ICD-10-CM | POA: Diagnosis not present

## 2019-01-16 DIAGNOSIS — F329 Major depressive disorder, single episode, unspecified: Secondary | ICD-10-CM | POA: Diagnosis not present

## 2019-01-16 DIAGNOSIS — Z0000 Encounter for general adult medical examination without abnormal findings: Secondary | ICD-10-CM | POA: Diagnosis not present

## 2019-03-29 DIAGNOSIS — H5213 Myopia, bilateral: Secondary | ICD-10-CM | POA: Diagnosis not present

## 2019-03-29 DIAGNOSIS — H52221 Regular astigmatism, right eye: Secondary | ICD-10-CM | POA: Diagnosis not present

## 2022-03-12 ENCOUNTER — Encounter: Payer: 59 | Admitting: Obstetrics and Gynecology

## 2022-03-16 NOTE — Progress Notes (Signed)
41 y.o. G57P2002 Married White or Caucasian Not Hispanic or Latino female here for annual exam. Pt would like to address exchanging her mirena IUD, placed in 1/17. Reports intermittent spotting. In the past few months she has been spotting on and off x 6 weeks.  Not sexually active. No bowel or bladder c/o.     No LMP recorded. (Menstrual status: IUD).          Sexually active: No.  The current method of family planning is IUD.    Exercising: No.     Smoker:  no  Health Maintenance: Pap: 10/31/15-WNL, HPV- neg History of abnormal Pap:  no MMG: 07/07/17-Dx B/L & Lt US-neg birads 1, 11/2021-WNL per pt @ [facility] location BMD: never Colonoscopy: never TDaP: 01/04/18 Gardasil: never had, declines.    reports that she has never smoked. She has never used smokeless tobacco. She reports that she does not drink alcohol and does not use drugs.  Past Medical History:  Diagnosis Date   Bilateral ovarian cysts    Endometriosis    GERD (gastroesophageal reflux disease)     Past Surgical History:  Procedure Laterality Date   DIAGNOSTIC LAPAROSCOPY  2002   Ovarian Cysts - Endometriosis   INTRAUTERINE DEVICE INSERTION  2012   [facility]   TOOTH EXTRACTION  2007    Current Outpatient Medications  Medication Sig Dispense Refill   levonorgestrel (MIRENA) 20 MCG/24HR IUD 1 each by Intrauterine route once. Inserted 10/31/15     lisdexamfetamine (VYVANSE) 30 MG capsule Take 1 capsule by mouth every morning.     No current facility-administered medications for this visit.    Family History  Problem Relation Age of Onset   Cancer Mother        Lung Cancer   Stroke Mother    Depression Mother    COPD Mother    Heart disease Mother    Hypertension Mother    Learning disabilities Mother    Heart disease Maternal Grandmother    Heart disease Maternal Grandfather    Cancer Maternal Grandfather        Lung Cancer    Review of Systems  All other systems reviewed and are negative.   Exam:   BP  102/72   Pulse 75   Ht 5' 6.5" (1.689 m)   Wt 146 lb (66.2 kg)   SpO2 93%   BMI 23.21 kg/m   Weight change: @WEIGHTCHANGE @ Height:   Height: 5' 6.5" (168.9 cm)  Ht Readings from Last 3 Encounters:  03/18/22 5' 6.5" (1.689 m)  12/27/17 5' 6.75" (1.695 m)  07/06/17 5\' 7"  (1.702 m)    General appearance: alert, cooperative and appears stated age Head: Normocephalic, without obvious abnormality, atraumatic Neck: no adenopathy, supple, symmetrical, trachea midline and thyroid normal to inspection and palpation Lungs: clear to auscultation bilaterally Cardiovascular: regular rate and rhythm Breasts: normal appearance, no masses or tenderness Abdomen: soft, non-tender; non distended,  no masses,  no organomegaly Extremities: extremities normal, atraumatic, no cyanosis or edema Skin: Skin color, texture, turgor normal. No rashes or lesions Lymph nodes: Cervical, supraclavicular, and axillary nodes normal. No abnormal inguinal nodes palpated Neurologic: Grossly normal   Pelvic: External genitalia:  no lesions              Urethra:  normal appearing urethra with no masses, tenderness or lesions              Bartholins and Skenes: normal  Vagina: normal appearing vagina with normal color and discharge, no lesions              Cervix: no lesions and IUD string < 1 cm               Bimanual Exam:  Uterus:  normal size, contour, position, consistency, mobility, non-tender and anteverted              Adnexa: no mass, fullness, tenderness               Rectovaginal: Confirms               Anus:  normal sphincter tone, no lesions  1. Well woman exam Mammogram UTD Declines gardasil Labs with primary  2. Screening for cervical cancer - Cytology - PAP  3. IUD check up Having spotting over the last 6 weeks Will try NSAID's for one week Can do a 3 month trial of OCP's (she will call if she wants to do this, no contraindications, risks reviewed)

## 2022-03-18 ENCOUNTER — Other Ambulatory Visit (HOSPITAL_COMMUNITY)
Admission: RE | Admit: 2022-03-18 | Discharge: 2022-03-18 | Disposition: A | Discharge status: Home / Self Care | Payer: PRIVATE HEALTH INSURANCE | Source: Ambulatory Visit | Attending: Obstetrics and Gynecology | Admitting: Obstetrics and Gynecology

## 2022-03-18 ENCOUNTER — Encounter: Payer: Self-pay | Admitting: Obstetrics and Gynecology

## 2022-03-18 ENCOUNTER — Ambulatory Visit (INDEPENDENT_AMBULATORY_CARE_PROVIDER_SITE_OTHER): Payer: PRIVATE HEALTH INSURANCE | Admitting: Obstetrics and Gynecology

## 2022-03-18 VITALS — BP 102/72 | HR 75 | Ht 66.5 in | Wt 146.0 lb

## 2022-03-18 DIAGNOSIS — Z124 Encounter for screening for malignant neoplasm of cervix: Secondary | ICD-10-CM

## 2022-03-18 DIAGNOSIS — Z30431 Encounter for routine checking of intrauterine contraceptive device: Secondary | ICD-10-CM

## 2022-03-18 DIAGNOSIS — Z01419 Encounter for gynecological examination (general) (routine) without abnormal findings: Secondary | ICD-10-CM

## 2022-03-18 NOTE — Patient Instructions (Signed)

## 2022-03-19 LAB — CYTOLOGY - PAP
Comment: NEGATIVE
Diagnosis: NEGATIVE
High risk HPV: NEGATIVE
